# Patient Record
Sex: Male | Born: 1980 | Race: White | Hispanic: No | Marital: Single | Smoking: Never smoker
Health system: Western US, Community
[De-identification: ages and names within clinical notes are randomized; demographics above are authoritative.]

---

## 2016-08-06 ENCOUNTER — Inpatient Hospital Stay
Admit: 2016-08-06 | Discharge: 2016-08-07 | Disposition: A | Discharge status: Home / Self Care | Payer: PRIVATE HEALTH INSURANCE

## 2016-08-06 ENCOUNTER — Emergency Department: Admit: 2016-08-06 | Payer: PRIVATE HEALTH INSURANCE

## 2016-08-06 ENCOUNTER — Emergency Department: Payer: PRIVATE HEALTH INSURANCE

## 2016-08-06 DIAGNOSIS — N132 Hydronephrosis with renal and ureteral calculous obstruction: Secondary | ICD-10-CM

## 2016-08-06 LAB — CBC WITH AUTO DIFFERENTIAL
Basophils %: 1 % (ref 0–2)
Basophils, Absolute: 0.1 10*3/ÂµL (ref 0.0–0.2)
Eosinophils %: 0 % (ref 0–7)
Eosinophils, Absolute: 0 10*3/ÂµL (ref 0.0–0.7)
HCT: 45.8 % (ref 42.0–54.0)
Hemoglobin: 16 g/dL (ref 12.0–18.0)
Lymphocytes %: 18 % — ABNORMAL LOW (ref 25–45)
Lymphocytes, Absolute: 2 10*3/ÂµL (ref 1.1–4.3)
MCH: 32.2 pg (ref 27.0–34.0)
MCHC: 35.1 g/dL (ref 32.0–36.0)
MCV: 91.9 fL (ref 81.0–99.0)
MPV: 11.2 fL — ABNORMAL HIGH (ref 7.4–10.4)
Monocytes %: 7 % (ref 0–12)
Monocytes, Absolute: 0.7 10*3/ÂµL (ref 0.0–1.2)
Neutrophils %: 74 % — ABNORMAL HIGH (ref 35–70)
Neutrophils, Absolute: 8.1 10*3/ÂµL — ABNORMAL HIGH (ref 1.6–7.3)
Platelet Count: 193 10*3/ÂµL (ref 150–400)
RBC: 4.98 10*6/ÂµL (ref 4.70–6.10)
RDW: 12.8 % (ref 11.5–14.5)
WBC: 10.9 10*3/ÂµL — ABNORMAL HIGH (ref 4.8–10.8)

## 2016-08-06 LAB — COMPREHENSIVE METABOLIC PANEL
ALT - Alanine Aminotransferase: 44 IU/L (ref 7–52)
AST - Aspartate Aminotransferase: 21 IU/L (ref 10–50)
Albumin/Globulin Ratio: 2 (ref >0.9)
Albumin: 4.4 g/dL (ref 3.5–5.0)
Alkaline Phosphatase: 57 IU/L (ref 34–104)
Anion Gap: 9 mmol/L (ref 3.0–11.0)
BUN: 18 mg/dL (ref 6–23)
Bilirubin Total: 0.7 mg/dL (ref 0.3–1.2)
CO2 - Carbon Dioxide: 24 mmol/L (ref 21.0–31.0)
Calcium: 9.4 mg/dL (ref 8.6–10.3)
Chloride: 106 mmol/L (ref 98–111)
Creatinine: 1.19 mg/dL (ref 0.65–1.30)
GFR Estimate: >60 mL/min/{1.73_m2} (ref >=60)
Globulin: 2.2 g/dL (ref 2.2–3.7)
Glucose: 115 mg/dL — ABNORMAL HIGH (ref 80–99)
Potassium: 3.8 mmol/L (ref 3.5–5.1)
Protein Total: 6.6 g/dL (ref 6.0–8.0)
Sodium: 139 mmol/L (ref 135–143)

## 2016-08-06 MED ORDER — HYDROmorphone (DILAUDID) syringe 1 mg
1 | Freq: Once | INTRAMUSCULAR | Status: AC
Start: 2016-08-06 — End: 2016-08-06
  Administered 2016-08-06: 22:00:00 1 mg via INTRAVENOUS

## 2016-08-06 MED ORDER — ondansetron (ZOFRAN) injection 8 mg
4 | Freq: Once | INTRAMUSCULAR | Status: AC
Start: 2016-08-06 — End: 2016-08-06
  Administered 2016-08-06: 22:00:00 4 mg via INTRAVENOUS

## 2016-08-06 MED ORDER — sodium chloride 0.9 % (NS) syringe 10 mL
INTRAMUSCULAR | Status: DC | PRN
Start: 2016-08-06 — End: 2016-08-06

## 2016-08-06 MED FILL — HYDROMORPHONE (PF) 1 MG/ML INJECTION SYRINGE: 1 mg/mL | INTRAMUSCULAR | Qty: 1

## 2016-08-06 MED FILL — ONDANSETRON HCL (PF) 4 MG/2 ML INJECTION SOLUTION: 4 | INTRAMUSCULAR | Qty: 4

## 2016-08-06 NOTE — ED Triage Notes (Signed)
Sent over by urgent care for possible testicular tortion. Severe pain to left testicle and into his low back.

## 2016-08-06 NOTE — Discharge Instructions (Signed)
Kidney Stones °Kidney stones (urolithiasis) are deposits that form inside your kidneys. The intense pain is caused by the stone moving through the urinary tract. When the stone moves, the ureter goes into spasm around the stone. The stone is usually passed in the urine.  °CAUSES  °· A disorder that makes certain neck glands produce too much parathyroid hormone (primary hyperparathyroidism). °· A buildup of uric acid crystals, similar to gout in your joints. °· Narrowing (stricture) of the ureter. °· A kidney obstruction present at birth (congenital obstruction). °· Previous surgery on the kidney or ureters. °· Numerous kidney infections. °SYMPTOMS  °· Feeling sick to your stomach (nauseous). °· Throwing up (vomiting). °· Blood in the urine (hematuria). °· Pain that usually spreads (radiates) to the groin. °· Frequency or urgency of urination. °DIAGNOSIS  °· Taking a history and physical exam. °· Blood or urine tests. °· CT scan. °· Occasionally, an examination of the inside of the urinary bladder (cystoscopy) is performed. °TREATMENT  °· Observation. °· Increasing your fluid intake. °· Extracorporeal shock wave lithotripsy--This is a noninvasive procedure that uses shock waves to break up kidney stones. °· Surgery may be needed if you have severe pain or persistent obstruction. There are various surgical procedures. Most of the procedures are performed with the use of small instruments. Only small incisions are needed to accommodate these instruments, so recovery time is minimized. °The size, location, and chemical composition are all important variables that will determine the proper choice of action for you. Talk to your health care provider to better understand your situation so that you will minimize the risk of injury to yourself and your kidney.  °HOME CARE INSTRUCTIONS  °· Drink enough water and fluids to keep your urine clear or pale yellow. This will help you to pass the stone or stone fragments. °· Strain  all urine through the provided strainer. Keep all particulate matter and stones for your health care provider to see. The stone causing the pain may be as small as a grain of salt. It is very important to use the strainer each and every time you pass your urine. The collection of your stone will allow your health care provider to analyze it and verify that a stone has actually passed. The stone analysis will often identify what you can do to reduce the incidence of recurrences. °· Only take over-the-counter or prescription medicines for pain, discomfort, or fever as directed by your health care provider. °· Keep all follow-up visits as told by your health care provider. This is important. °· Get follow-up X-rays if required. The absence of pain does not always mean that the stone has passed. It may have only stopped moving. If the urine remains completely obstructed, it can cause loss of kidney function or even complete destruction of the kidney. It is your responsibility to make sure X-rays and follow-ups are completed. Ultrasounds of the kidney can show blockages and the status of the kidney. Ultrasounds are not associated with any radiation and can be performed easily in a matter of minutes. °· Make changes to your daily diet as told by your health care provider. You may be told to: °¨ Limit the amount of salt that you eat. °¨ Eat 5 or more servings of fruits and vegetables each day. °¨ Limit the amount of meat, poultry, fish, and eggs that you eat. °· Collect a 24-hour urine sample as told by your health care provider. You may need to collect another urine sample every 6-12   months. °SEEK MEDICAL CARE IF: °· You experience pain that is progressive and unresponsive to any pain medicine you have been prescribed. °SEEK IMMEDIATE MEDICAL CARE IF:  °· Pain cannot be controlled with the prescribed medicine. °· You have a fever or shaking chills. °· The severity or intensity of pain increases over 18 hours and is not  relieved by pain medicine. °· You develop a new onset of abdominal pain. °· You feel faint or pass out. °· You are unable to urinate. °  °This information is not intended to replace advice given to you by your health care provider. Make sure you discuss any questions you have with your health care provider. °  °Document Released: 12/15/2005 Document Revised: 09/05/2015 Document Reviewed: 05/18/2013 °Elsevier Interactive Patient Education ©2016 [employer] ° °

## 2016-08-06 NOTE — ED Provider Notes (Signed)
History   Manuel Perkins is a 35 y.o. year old male presenting with Groin Swelling  .    HPI patient is 35 years old. Noticed earlier today pain left testicle somewhat radiating up in the abdomen towards the left flank.  Pain was hurting intolerable for several hours but then about an hour prior to arrival acute onset severe pain is feeling at most it in the testicle. Seen in urgent care and sent over here for the possibility of testicular torsion. He's not noted any gross hematuria no fever chills or dysuria. No penile discharge.  No chest pain shortness of breath or any other complaints except for left lower quadrant radiating down into the testicle.  He thinks that the testicle may be swollen but really is not exam didn't significantly.    No past medical history on file.    No past surgical history on file.    No family history on file.    Social History   Substance Use Topics   . Smoking status: Not on file   . Smokeless tobacco: Not on file   . Alcohol use Not on file     No existing history information found.    Patient's Medications    No medications on file       Review of Systems review of systems is as in history of present illness otherwise reviewed without acute abnormalities    Physical Exam   BP 117/65  Pulse 86  Temp 36 ?C (96.8 ?F) (Temporal)   Resp 20  SpO2 95%    Physical Exam and oriented appears uncomfortable is pale not really writhing around alert oriented excellent historian does not appear tachypneic or short of breath  HEENT grossly negative  Lungs clear  Cardiovascular regular rate and rhythm  Abdomen soft there is some tenderness to left lower quadrant rebound or guarding normal active bowel sounds  Flank area mild tenderness to palpation of the left flank  Testicle appears normalNormal external genitalia with Normal vulva.  Normal vagina with no polyps or lesions and with physiologic discharge.  Normal cervix with normal mucosa and without CMT. i normal shaved there is no per to be  any enlargement the testicles and normal in size and shape no significant tenderness over the epididymis. Penis appears normal scrotum is completely normal  Lower extremities normal    ED Course     Results for orders placed or performed during the hospital encounter of 08/06/16 (from the past 24 hour(s))   CBC with Auto Differential -STAT    Collection Time: 08/06/16  3:45 PM   Result Value Ref Range    WBC 10.9 (H) 4.8 - 10.8 10*3/?L    RBC 4.98 4.70 - 6.10 10*6/?L    Hemoglobin 16.0 12.0 - 18.0 g/dL    HCT 74.2 59.5 - 63.8 %    MCV 91.9 81.0 - 99.0 fL    MCH 32.2 27.0 - 34.0 pg    MCHC 35.1 32.0 - 36.0 g/dL    RDW 75.6 43.3 - 29.5 %    Platelet Count 193 150 - 400 10*3/?L    MPV 11.2 (H) 7.4 - 10.4 fL    Neutrophils % 74 (H) 35 - 70 %    Lymphocytes % 18 (L) 25 - 45 %    Monocytes % 7 0 - 12 %    Eosinophils % 0 0 - 7 %    Basophils % 1 0 - 2 %    Neutrophils,  Absolute 8.1 (H) 1.6 - 7.3 10*3/?L    Lymphocytes, Absolute 2.0 1.1 - 4.3 10*3/?L    Monocytes, Absolute 0.7 0.0 - 1.2 10*3/?L    Eosinophils, Absolute 0.0 0.0 - 0.7 10*3/?L    Basophils, Absolute 0.1 0.0 - 0.2 10*3/?L    Differential Type Automated Differential    Comprehensive Metabolic Panel -STAT    Collection Time: 08/06/16  4:26 PM   Result Value Ref Range    Sodium 139 135 - 143 mmol/L    Potassium 3.8 3.5 - 5.1 mmol/L    Chloride 106 98 - 111 mmol/L    CO2 - Carbon Dioxide 24.0 21.0 - 31.0 mmol/L    Glucose 115 (H) 80 - 99 mg/dL    BUN 18 6 - 23 mg/dL    Creatinine 9.14 7.82 - 1.30 mg/dL    Calcium 9.4 8.6 - 95.6 mg/dL    AST - Aspartate Aminotransferase 21 10 - 50 IU/L    ALT - Alanine Amino transferase 44 7 - 52 IU/L    Alkaline Phosphatase 57 34 - 104 IU/L    Bilirubin Total 0.7 0.3 - 1.2 mg/dL    Protein Total 6.6 6.0 - 8.0 g/dL    Albumin 4.4 3.5 - 5.0 g/dL    Globulin 2.2 2.2 - 3.7 g/dL    Albumin/Globulin Ratio 2.0 >0.9    Anion Gap 9.0 3.0 - 11.0 mmol/L    GFR Estimate >60 >=60 mL/min/1.2m*2    GFR Additional Info         CT renal colic  without contrast   Final Result   IMPRESSION:  3 mm obstructing calculus LEFT proximal ureter at the level of the    L3 vertebral body, just beyond the LEFT ureteropelvic junction. Mild associated    LEFT hydronephrosis.               Ultrasound scrotum and testicles   Final Result   IMPRESSION:      Normal testicular ultrasound.                 Procedures    MDM ultrasound of his testicles were obtained to evaluate for the possibility of torsion and they are normal. Also looking up at the kidneys is no signs of obvious kidney stone on ultrasound but clinically he certainly appears to have a kidney stone.    Does not appear to have other reasons for abdominal pain  IV access obtained the patient is given pain medication and is significantly more comfortable.  CT renal colic is obtained and does show 3 mm kidney stone in the distal ureter.    Patient is now given Toradol and reports reasonable pain control and ultimately given one extra dose of Dilaudid.    At discharge she is pain-free.    I discussed with the patient that this stone should be expected to pass at 3 mm.    The certainly may be more painful and does have a bili that this will pass it is more painful as a spasming I am provided oxycodone.    ED Disposition: No Disposition on File    Diagnoses that have been ruled out:   None   Diagnoses that are still under consideration:   None   Final diagnoses:   None    3 mm kidney stone left ureter    DECISION TO ADMIT:          Kandice Robinsons, MD  08/06/16 2310

## 2016-08-07 ENCOUNTER — Emergency Department: Admit: 2016-08-07 | Payer: PRIVATE HEALTH INSURANCE

## 2016-08-07 ENCOUNTER — Inpatient Hospital Stay
Admit: 2016-08-07 | Discharge: 2016-08-07 | Disposition: A | Discharge status: Home / Self Care | Payer: PRIVATE HEALTH INSURANCE | Attending: Family

## 2016-08-07 DIAGNOSIS — N132 Hydronephrosis with renal and ureteral calculous obstruction: Secondary | ICD-10-CM

## 2016-08-07 LAB — BASIC METABOLIC PANEL
Anion Gap: 16 mmol/L — ABNORMAL HIGH (ref 3.0–11.0)
BUN: 19 mg/dL (ref 6–23)
CO2 - Carbon Dioxide: 21 mmol/L (ref 21.0–31.0)
Calcium: 9.9 mg/dL (ref 8.6–10.3)
Chloride: 102 mmol/L (ref 98–111)
Creatinine: 1.85 mg/dL — ABNORMAL HIGH (ref 0.65–1.30)
GFR Estimate: 42 mL/min/{1.73_m2} — ABNORMAL LOW (ref >=60)
Glucose: 116 mg/dL — ABNORMAL HIGH (ref 80–99)
Potassium: 3.7 mmol/L (ref 3.5–5.1)
Sodium: 139 mmol/L (ref 135–143)

## 2016-08-07 LAB — UA WITH REFLEX MICRO
Ascorbic Acid, Urine: NEGATIVE
Bacteria, Urine: NONE SEEN /HPF
Bilirubin, Urine: NEGATIVE
Glucose, Urine: NEGATIVE mg/dL
Ketones, Urine: NEGATIVE mg/dL
Leukocyte Esterase, Urine: NEGATIVE
Nitrite, Urine: NEGATIVE
Protein, Urine: NEGATIVE mg/dL
RBC, Urine: 15 /HPF — ABNORMAL HIGH (ref <=5)
Specific Gravity, Urine: 1.02 (ref 1.003–1.030)
Squamous Epithelial, Urine: 0 /HPF (ref <=5)
Urobilinogen, Urine: NORMAL mg/dL
White Blood Cell Microscopic Numeric: 1 /HPF (ref <=9)
pH, UA: 6 (ref 5.0–7.5)

## 2016-08-07 LAB — CREATININE ED
Creatinine: 1.65 mg/dL — ABNORMAL HIGH (ref 0.65–1.30)
GFR Estimate: 48 mL/min/{1.73_m2} — ABNORMAL LOW (ref >=60)

## 2016-08-07 LAB — CBC WITH AUTO DIFFERENTIAL
Basophils %: 1 % (ref 0–2)
Basophils, Absolute: 0.1 10*3/ÂµL (ref 0.0–0.2)
Eosinophils %: 0 % (ref 0–7)
Eosinophils, Absolute: 0 10*3/ÂµL (ref 0.0–0.7)
HCT: 45.3 % (ref 42.0–54.0)
Hemoglobin: 15.8 g/dL (ref 12.0–18.0)
Lymphocytes %: 12 % — ABNORMAL LOW (ref 25–45)
Lymphocytes, Absolute: 1.5 10*3/ÂµL (ref 1.1–4.3)
MCH: 32.1 pg (ref 27.0–34.0)
MCHC: 35 g/dL (ref 32.0–36.0)
MCV: 91.8 fL (ref 81.0–99.0)
MPV: 10.8 fL — ABNORMAL HIGH (ref 7.4–10.4)
Monocytes %: 7 % (ref 0–12)
Monocytes, Absolute: 0.9 10*3/ÂµL (ref 0.0–1.2)
Neutrophils %: 80 % — ABNORMAL HIGH (ref 35–70)
Neutrophils, Absolute: 10.3 10*3/??L — ABNORMAL HIGH (ref 1.6–7.3)
Platelet Count: 172 10*3/ÂµL (ref 150–400)
RBC: 4.93 10*6/ÂµL (ref 4.70–6.10)
RDW: 12.3 % (ref 11.5–14.5)
WBC: 12.9 10*3/ÂµL — ABNORMAL HIGH (ref 4.8–10.8)

## 2016-08-07 MED ORDER — naproxen (NAPROSYN) 500 MG tablet
500 | ORAL_TABLET | Freq: Two times a day (BID) | ORAL | 0 refills | 30.00000 days | Status: AC
Start: 2016-08-07 — End: 2016-08-17
  Filled 2016-08-07: qty 20, 10d supply, fill #0

## 2016-08-07 MED ORDER — ondansetron (ZOFRAN) injection 8 mg
4 | Freq: Once | INTRAMUSCULAR | Status: AC
Start: 2016-08-07 — End: 2016-08-07
  Administered 2016-08-07: 13:00:00 4 mg via INTRAVENOUS

## 2016-08-07 MED ORDER — sodium chloride (NS) 0.9% bolus 1,000 mL
Freq: Once | INTRAVENOUS | Status: AC
Start: 2016-08-07 — End: 2016-08-07
  Administered 2016-08-07: 14:00:00 via INTRAVENOUS

## 2016-08-07 MED ORDER — sodium chloride 0.9 % (NS) infusion
INTRAVENOUS | Status: AC
Start: 2016-08-07 — End: 2016-08-07
  Administered 2016-08-07: 13:00:00 via INTRAVENOUS

## 2016-08-07 MED ORDER — HYDROmorphone (DILAUDID) syringe 1 mg
1 | Freq: Once | INTRAMUSCULAR | Status: DC
Start: 2016-08-07 — End: 2016-08-06

## 2016-08-07 MED ORDER — ketorolac (TORADOL) injection 30 mg
30 | Freq: Once | INTRAMUSCULAR | Status: AC
Start: 2016-08-07 — End: 2016-08-07
  Administered 2016-08-07: 13:00:00 30 mg via INTRAVENOUS

## 2016-08-07 MED ORDER — tamsulosin (FLOMAX) 0.4 mg Cp24
0.4 | ORAL_CAPSULE | Freq: Every day | ORAL | 0 refills | 90.00000 days | Status: DC
Start: 2016-08-07 — End: 2018-02-05

## 2016-08-07 MED ORDER — HYDROmorphone (DILAUDID) 1 mg/mL syringe
1 | INTRAMUSCULAR | Status: AC
Start: 2016-08-07 — End: 2016-08-06
  Administered 2016-08-07: 01:00:00 1 mg/mL via INTRAVENOUS

## 2016-08-07 MED ORDER — HYDROmorphone (DILAUDID) syringe 1 mg
1 | Freq: Once | INTRAMUSCULAR | Status: AC
Start: 2016-08-07 — End: 2016-08-07
  Administered 2016-08-07: 13:00:00 1 mg via INTRAVENOUS

## 2016-08-07 MED ORDER — oxyCODONE (ROXICODONE) 5 MG immediate release tablet
5 | ORAL_TABLET | ORAL | 0 refills | 9.50000 days | Status: DC | PRN
Start: 2016-08-07 — End: 2016-08-10
  Filled 2016-08-07: qty 20, 4d supply, fill #0

## 2016-08-07 MED ORDER — promethazine (PHENERGAN) 25 MG tablet
25 | ORAL_TABLET | Freq: Four times a day (QID) | ORAL | 0 refills | Status: AC | PRN
Start: 2016-08-07 — End: 2016-08-13

## 2016-08-07 MED ORDER — oxyCODONE 10 mg Tab immediate release tablet
10 | ORAL_TABLET | ORAL | 0 refills | 9.50000 days | Status: AC
Start: 2016-08-07 — End: 2016-08-11

## 2016-08-07 MED FILL — ONDANSETRON HCL (PF) 4 MG/2 ML INJECTION SOLUTION: 4 | INTRAMUSCULAR | Qty: 4

## 2016-08-07 MED FILL — KETOROLAC 30 MG/ML (1 ML) INJECTION SOLUTION: 30 mg/mL | INTRAMUSCULAR | Qty: 1

## 2016-08-07 MED FILL — HYDROMORPHONE (PF) 1 MG/ML INJECTION SYRINGE: 1 mg/mL | INTRAMUSCULAR | Qty: 1

## 2016-08-07 MED FILL — SODIUM CHLORIDE 0.9 % INTRAVENOUS SOLUTION: 1000.0000 mL | INTRAVENOUS | Qty: 1000

## 2016-08-07 NOTE — ED Notes (Signed)
Pt ambulated to restroom without difficulty

## 2016-08-07 NOTE — ED Notes (Signed)
Pt resting quietly, lights out in room. [place] tech here to perform studt, NAD noted.

## 2016-08-07 NOTE — ED Triage Notes (Signed)
Pt c/o left flank pain, was seen here yesterday diagnosed with a kidney stone and sent home. Pt's pain was gone but returned worse this morning at 02:00. Has not urinated in 12 hours. Denies N/V/D.

## 2016-08-07 NOTE — ED Notes (Signed)
Pt's [place] study is complete, pt states pain starting to creep back up. 4-5/10. No nausea.

## 2016-08-07 NOTE — ED Provider Notes (Signed)
[facility] EMERGENCY DEPARTMENT ENCOUNTER    History and Physical     Name: Manuel Perkins  DOB: [birth year] 35 y.o.  MRN: [id number]  CSN: [id number]    HISTORY:     CHIEF COMPLAINT    Chief Complaint   Patient presents with   . Flank Pain       HPI    Argelio Granier is a 35 y.o. male who presents to the ER with continued pain from kidney stone. He had a 3 mm stone diagnosed via CT scan yesterday was the proximal ureter obstructing. Mild left hydronephrosis. He returns today with reports of increasing pain. He has states he has not put out urine in 12 hours. 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 He is taking oxycodone at home, as well as Flomax. He took 2 doses of oxycodone last night, states his symptoms are not improved. Bladder scan arrival to the room shows 91 ML's urine. He is afebrile.    PAST MEDICAL HISTORY    History reviewed. No pertinent past medical history.    SURGICAL HISTORY    History reviewed. No pertinent surgical history.    CURRENT MEDICATIONS    Prior to Admission medications    Medication Sig Start Date End Date Taking? Authorizing Provider   naproxen (NAPROSYN) 500 MG tablet Take 1 tablet by mouth 2 times daily with meals for 20 doses. 08/07/16 08/17/16  Youlanda Mighty, FNP   oxyCODONE (ROXICODONE) 5 MG immediate release tablet Take 1 tablet by mouth every 4 hours as needed for Pain for up to 10 days. 08/06/16 08/16/16  Kandice Robinsons, MD   oxyCODONE 10 mg Tab immediate release tablet Take 1 tablet by mouth every 4 hours for 20 doses. 08/07/16 08/11/16  Youlanda Mighty, FNP   promethazine (PHENERGAN) 25 MG tablet Take 1 tablet by mouth every 6 hours as needed for Nausea for up to 12 doses. 08/06/16 08/13/16  Kandice Robinsons, MD   tamsulosin (FLOMAX) 0.4 mg Cp24 Take 1 capsule by mouth daily. 08/06/16 09/05/16  Kandice Robinsons, MD       ALLERGIES    Review of patient's allergies indicates no known allergies.    FAMILY HISTORY    History reviewed. No pertinent  family history.    SOCIAL HISTORY    Social History   Substance Use Topics   . Smoking status: None   . Smokeless tobacco: None   . Alcohol use None     Other Social History Comments:       REVIEW OF SYSTEMS:   10 point review of systems negative except for history of present illness  PHYSICAL EXAM:   VITAL SIGNS:    Initial Vitals   BP 08/07/16 0538 203/89   Pulse 08/07/16 0538 94   Resp 08/07/16 0538 20   Temp 08/07/16 0538 36.7 ?C (98.1 ?F)   SpO2 08/07/16 0538 100 %     Constitutional:  Alert. Acutely distressed. Oriented. HENT:  Normocephalic, Atraumatic, Bilateral external ears normal, Oropharynx moist, No oral exudates, Nose normal. Neck- Normal range of motion, No tenderness, Supple, No stridor.   Eyes:  PERRL, EOMI, Conjunctiva normal, No discharge.   Respiratory:  Normal breath sounds, No respiratory distress, No wheezing, No chest tenderness.   Cardiovascular:  Normal heart rate, Normal rhythm, No murmurs, No rubs, No gallops.   GI:  No acute abdominal tenderness on exam. No focal findings. No peritoneal signs. GU:  External genitalia appear normal, No masses or lesions. No discharge. No CVA tenderness.   Musculoskeletal:  Intact distal pulses, No edema, No tenderness, No cyanosis, No clubbing. Good range of motion in all major joints. No tenderness to palpation or major deformities noted. Back- No tenderness.   Integument:  Pale in appearance.  Lymphatic:  No lymphadenopathy noted.   Neurologic:  Alert & oriented x 3, Normal motor function, Normal sensory function, No focal deficits noted.   Psychiatric: Anxious and in pain   DATA:       LABS    Results for orders placed or performed during the hospital encounter of 08/07/16 (from the past 24 hour(s))   Basic Metabolic Panel -STAT    Collection Time: 08/07/16  5:56 AM   Result Value Ref Range    Sodium 139 135 - 143 mmol/L    Potassium 3.7 3.5 - 5.1 mmol/L    Chloride 102 98 - 111 mmol/L    CO2 - Carbon Dioxide 21.0 21.0 - 31.0 mmol/L    Glucose 116 (H)  80 - 99 mg/dL    BUN 19 6 - 23 mg/dL    Creatinine 3.87 (H) 0.65 - 1.30 mg/dL    Calcium 9.9 8.6 - 56.4 mg/dL    Anion Gap 33.2 (H) 3.0 - 11.0 mmol/L    GFR Estimate 42 (L) >=60 mL/min/1.50m*2    GFR Additional Info     CBC with Auto Differential -STAT    Collection Time: 08/07/16  6:02 AM   Result Value Ref Range    WBC 12.9 (H) 4.8 - 10.8 10*3/?L    RBC 4.93 4.70 - 6.10 10*6/?L    Hemoglobin 15.8 12.0 - 18.0 g/dL    HCT 95.1 88.4 - 16.6 %    MCV 91.8 81.0 - 99.0 fL    MCH 32.1 27.0 - 34.0 pg    MCHC 35.0 32.0 - 36.0 g/dL    RDW 06.3 01.6 - 01.0 %    Platelet Count 172 150 - 400 10*3/?L    MPV 10.8 (H) 7.4 - 10.4 fL    Neutrophils % 80 (H) 35 - 70 %    Lymphocytes % 12 (L) 25 - 45 %    Monocytes % 7 0 - 12 %    Eosinophils % 0 0 - 7 %    Basophils % 1 0 - 2 %    Neutrophils, Absolute 10.3 (H) 1.6 - 7.3 10*3/?L    Lymphocytes, Absolute 1.5 1.1 - 4.3 10*3/?L    Monocytes, Absolute 0.9 0.0 - 1.2 10*3/?L    Eosinophils, Absolute 0.0 0.0 - 0.7 10*3/?L    Basophils, Absolute 0.1 0.0 - 0.2 10*3/?L    Differential Type Automated Differential    Urinalysis with Reflex Microscopic -Once    Collection Time: 08/07/16  6:38 AM   Result Value Ref Range    Color, Urine Yellow Yellow, Straw    Clarity, Urine Clear Clear    Glucose, Urine Negative Negative mg/dL    Bilirubin, Urine Negative Negative    Ketones, Urine Negative Negative mg/dL    Specific Gravity, Urine 1.020 1.003 - 1.030    Blood, Urine 2+ (A) Negative    pH, UA 6.0 5.0 - 7.5    Protein, Urine Negative Negative mg/dL    Urobilinogen, Urine Normal Normal mg/dL    Nitrite, Urine Negative Negative    Leukocyte Esterase, Urine Negative Negative    Ascorbic Acid, Urine Negative Negative    White Blood  Cell Microscopic Numeric 1 <=9 /HPF    RBC, Urine 15 (H) <=5 /HPF    Bacteria, Urine None Seen None Seen /HPF    Squamous Epithelial, Urine 0 <=5 /HPF   Creatinine ED -STAT    Collection Time: 08/07/16  9:59 AM   Result Value Ref Range    Creatinine 1.65 (H) 0.65 - 1.30  mg/dL    GFR Additional Info      GFR Estimate 48 (L) >=60 mL/min/1.88m*2       RADIOLOGY/PROCEDURES    Ultrasound Scrotum And Testicles    Result Date: 08/06/2016  US SCROTUM AND TESTICLES  08/06/2016 4:10 PM PROVIDED CLINICAL INDICATIONS:  Severe pain.    COMPARISON:  None. FINDINGS:  Right testis measures 4.0 x 2.4 x 3.8 cm, and the left testis measures 3.8 x 2.3 x 3.9 cm.  Both testes demonstrate normal homogeneous echogenicity without focal lesion. Normal color Doppler and arterial flow is seen in both testes. The epididymi are of normal size and echogenicity bilaterally.     Normal color doppler flow in the epididymi. No hydrocele. No varicocele. No scrotal wall thickening.     IMPRESSION: Normal testicular ultrasound.        Ct Renal Colic Without Contrast    Result Date: 08/06/2016  CT RENAL COLIC WO CONTRAST  08/06/2016 4:26 PM PROVIDED CLINICAL INDICATIONS:  Left flank pain    ADDITIONAL CLINICAL HISTORY:  None. COMPARISON:  None. TECHNIQUE: 5 mm thick contiguous axial imaging of the abdomen was performed with spiral technique without intravenous contrast using renal colic protocol.  Suboptimal evaluation of solid organs, viscera, and vascular structures due to lack of contrast. The images were reformatted in coronal and sagittal plane. Dose reduction techniques were used including but not limited to automated exposure control (AEC), iterative reconstruction technique, and/or mA and/or KV dose adjustments based on patient's size. FINDINGS:  Kidneys are symmetric in size. There is mild LEFT hydronephrosis. A 3 mm shortening calculus is demonstrated in the LEFT proximal ureter at the L3 vertebral body level, just beyond the ureteropelvic junction. No other nephrolithiasis is demonstrated. No right-sided hydronephrosis or exophytic renal masses bilaterally. Urinary bladder has a smooth contour. No bladder calculus. ABDOMEN: Liver: Visualized liver is normal in size and homogeneous in attenuation. Gallbladder: No  calcified gallstones. Pancreas: No peripancreatic fat stranding or fluid collections. Spleen: Visualized spleen has normal size and homogeneous attenuation. Adrenal glands: Normal. Aorta and IVC: Normal caliber. Stomach and bowel: Stomach is unremarkable. Visualized small and large bowel are nondilated. No pericolic fat stranding. Appendix is normal. PELVIS: Retroperitoneum and mesentery: No mass or adenopathy. Peritoneum: No free intraperitoneal fluid or pneumoperitoneum. No mass. Genitourinary: Prostate gland has a normal size. Abdominal wall: No hernia. Lower chest: Visualized lung bases are clear. Bones: No lytic or blastic osseous lesions.     IMPRESSION:  3 mm obstructing calculus LEFT proximal ureter at the level of the L3 vertebral body, just beyond the LEFT ureteropelvic junction. Mild associated LEFT hydronephrosis.     Ultrasound Kidney    Result Date: 08/07/2016  US KIDNEY  08/07/2016 9:14 AM PROVIDED CLINICAL INDICATIONS: evaluate for worsening left hydronephrosis    ADDITIONAL CLINICAL HISTORY: None. COMPARISON: Renal colic CT 08/06/2016. TECHNIQUE: Transabdominal real-time gray-scale and color-flow Doppler interrogation was performed. FINDINGS:  The kidneys are symmetric in size, with normal renal cortical thickness and echogenicity. Minimal left-sided hydronephrosis. No RIGHT hydronephrosis. No sonographic demonstration of nephrolithiasis. The right kidney measures 11.6 x 5.3 x 5.7 cm, with resistive  index of 0.65. The left kidney measures 12.2 x 6.0 x 5.4 cm, with resistive index of 0.73. No cysts or solid renal masses. The bladder is smooth walled without filling defect. Mild postvoid residual of 22 mL.  RIGHT ureteral jet is demonstrated. LEFT ureteral jet is not visualized. Incidental demonstration of mild hepatic steatosis with increased echogenicity of the liver parenchyma.     IMPRESSION:  Minimal left-sided hydronephrosis, not progressed from prior exam. LEFT ureteral jet is also not visualized  at the urinary bladder. No right-sided hydronephrosis. Incidental demonstration of hepatic steatosis.      PLAN:     ED COURSE & MEDICAL DECISION MAKING    35 year old male presents with continuing pain secondary to 3 mm kidney stone in the proximal ureter on the left. He is taking oxycodone at home, no other anti-inflammatories. Pain is quite severe arrival. The patient is concerned of the possibility of kidney failure.    Saline lock placed. Given 1 L normal saline. Small amount of urine in his bladder on arrival    Dilaudid 1 mg IV, 30 mg Toradol IV, 8 mg Zofran IV    Patient gets good relief of his discomfort with treatments, he does have some hypoxia after administration of IV Dilaudid. I believe this is combining with oral narcotic she took prior to his arrival precipitating this, but is easily arousable, no airway protective issues.    BMP demonstrates a bump in his creatinine to 1.85 today from 1.2 yesterday.    Ultrasound is repeated which demonstrates no significant hydro or increase in hydro, though jets or unable to be seen. A consult radiology on the lack of chest given this 3 mm stone, states this is often a normal variant and medically significant for obstruction, but not as concerning without severe hydronephrosis.    CBC demonstrates white blood cell count 12.9, no bandemia  Urinalysis negative for infection    Patient was given 1 L normal saline, and he does consume 2 L oral intake. No vomiting. Pain is persistently well-controlled. I did recheck his creatinine the ER, sound 1.65 after fluids.    The pain is well controlled and oblique. Requires hospital admission. I did give him a prescription for 10 mg oxycodone to take instead of the 5 mg tablets, in hopes that this will control his pain. Added naproxen to his current pain regimen. He will continue Flomax 0.4 mg once a day.    FINAL IMPRESSION    1. 3 mm obstructing stone left proximal ureter   2. Dehydration  3. Elevated creatinine    This  document was created with the assistance of voice-to-text technology. Effort has been made to minimize transcription errors. Please allow for homonyms and other similar transcription errors.       Youlanda Mighty, FNP  08/07/16 1534

## 2016-08-07 NOTE — ED Notes (Signed)
Pt sleeping, NAD noted.

## 2016-08-07 NOTE — Discharge Instructions (Signed)
I have given you a prescription for oxycodone twice the strength of what you are taking. Please take this over the next several days. Add naproxen 500 mg  If you develop severe pain increased, or inability to urinate please return.  You were mildly dehydrated, but sure kidney function is improving during her ER stay and after fluid  Please call Dr. Stacie Acres for follow-up  Dehydration, Adult  Dehydration means your body does not have as much fluid or water as it needs. It happens when you take in less fluid than you lose. Your kidneys, brain, and heart will not work properly without the right amount of fluids.   Dehydration can range from mild to severe. It should be treated right away to help prevent it from becoming severe.  HOME CARE  ? Drink enough fluid to keep your pee (urine) clear or pale yellow.  ? Drink water or fluid slowly by taking small sips. You can also try sucking on ice cubes.  ? Have food or drinks that contain electrolytes. Examples include bananas and sports drinks.  ? Take over-the-counter and prescription medicines only as told by your doctor.  ? Prepare oral rehydration solution (ORS) according to the instructions that came with it. Take sips of ORS every 5 minutes until your pee returns to normal.  ? If you are throwing up (vomiting) or have watery poop (diarrhea), keep trying to drink water, ORS, or both.  ? If you have watery poop, avoid:  ? Drinks with caffeine.  ? Fruit juice.  ? Milk.  ? Carbonated soft drinks.  ? Do not take salt tablets. This can lead to having too much sodium in your body (hypernatremia).  GET HELP IF:  ? You cannot eat or drink without throwing up.  ? You have had mild watery poop for longer than 24 hours.  ? You have a fever.  GET HELP RIGHT AWAY IF:   ? You have very strong thirst.  ? You have very bad watery poop.  ? You have not peed in 6-8 hours, or you have peed only a small amount of very dark pee.  ? You have shriveled skin.  ? You are dizzy, confused, or  both.     This information is not intended to replace advice given to you by your health care provider. Make sure you discuss any questions you have with your health care provider.     Document Released: 10/11/2009 Document Revised: 09/05/2015 Document Reviewed: 05/02/2015  Elsevier Interactive Patient Education ?2016 Else[employer]      Kidney Stones  Kidney stones (urolithiasis) are solid masses that form inside your kidneys. The intense pain is caused by the stone moving through the kidney, ureter, bladder, and urethra (urinary tract). When the stone moves, the ureter starts to spasm around the stone. The stone is usually passed in your pee (urine).   HOME CARE  ? Drink enough fluids to keep your pee clear or pale yellow. This helps to get the stone out.  ? Take a 24-hour pee (urine) sample as told by your doctor. You may need to take another sample every 6-12 months.  ? Strain all pee through the provided strainer. Do not pee without peeing through the strainer, not even once. If you pee the stone out, catch it in the strainer. The stone may be as small as a grain of salt. Take this to your doctor. This will help your doctor figure out what you can do to try  to prevent more kidney stones.  ? Only take medicine as told by your doctor.  ? Make changes to your daily diet as told by your doctor. You may be told to:    Limit how much salt you eat.    Eat 5 or more servings of fruits and vegetables each day.    Limit how much meat, poultry, fish, and eggs you eat.  ? Keep all follow-up visits as told by your doctor. This is important.  ? Get follow-up X-rays as told by your doctor.  GET HELP IF:  You have pain that gets worse even if you have been taking pain medicine.  GET HELP RIGHT AWAY IF:   ? Your pain does not get better with medicine.  ? You have a fever or shaking chills.  ? Your pain increases and gets worse over 18 hours.  ? You have new belly (abdominal) pain.  ? You feel faint or pass out.  ? You are unable  to pee.     This information is not intended to replace advice given to you by your health care provider. Make sure you discuss any questions you have with your health care provider.     Document Released: 06/02/2008 Document Revised: 09/05/2015 Document Reviewed: 05/18/2013  Elsevier Interactive Patient Education ?2016 Elsevier Inc.

## 2016-08-08 ENCOUNTER — Emergency Department: Admit: 2016-08-08 | Payer: PRIVATE HEALTH INSURANCE

## 2016-08-08 ENCOUNTER — Inpatient Hospital Stay
Admission: EM | Admit: 2016-08-08 | Discharge: 2016-08-10 | Disposition: A | Discharge status: Home / Self Care | Payer: PRIVATE HEALTH INSURANCE | Admitting: DO

## 2016-08-08 DIAGNOSIS — K567 Ileus, unspecified: Secondary | ICD-10-CM

## 2016-08-08 LAB — BASIC METABOLIC PANEL
Anion Gap: 10 mmol/L (ref 3.0–11.0)
BUN: 13 mg/dL (ref 6–23)
CO2 - Carbon Dioxide: 27 mmol/L (ref 21.0–31.0)
Calcium: 9.2 mg/dL (ref 8.6–10.3)
Chloride: 94 mmol/L — ABNORMAL LOW (ref 98–111)
Creatinine: 1.7 mg/dL — ABNORMAL HIGH (ref 0.65–1.30)
GFR Estimate: 46 mL/min/{1.73_m2} — ABNORMAL LOW (ref >=60)
Glucose: 137 mg/dL — ABNORMAL HIGH (ref 80–99)
Potassium: 4 mmol/L (ref 3.5–5.1)
Sodium: 131 mmol/L — ABNORMAL LOW (ref 135–143)

## 2016-08-08 LAB — PT & PTT
APTT: 25 Seconds (ref 23–36)
INR: 1 (ref 0.9–1.2)
Protime: 11.5 Seconds (ref 9.9–13.4)

## 2016-08-08 LAB — UA WITH AUTO MICRO
Ascorbic Acid, Urine: NEGATIVE
Bacteria, Urine: NONE SEEN /HPF
Bilirubin, Urine: NEGATIVE
Blood, Urine: NEGATIVE
Glucose, Urine: NEGATIVE mg/dL
Ketones, Urine: NEGATIVE mg/dL
Leukocyte Esterase, Urine: NEGATIVE
Nitrite, Urine: NEGATIVE
Protein, Urine: NEGATIVE mg/dL
RBC, Urine: 0 /HPF (ref <=5)
Specific Gravity, Urine: 1.023 (ref 1.003–1.030)
Squamous Epithelial, Urine: 0 /HPF (ref <=5)
Urobilinogen, Urine: NORMAL mg/dL
White Blood Cell Microscopic Numeric: 3 /HPF (ref <=9)
pH, UA: 5 (ref 5.0–7.5)

## 2016-08-08 LAB — CBC WITH AUTO DIFFERENTIAL
Basophils %: 0 % (ref 0–2)
Basophils, Absolute: 0.1 10*3/ÂµL (ref 0.0–0.2)
Eosinophils %: 0 % (ref 0–7)
Eosinophils, Absolute: 0 10*3/ÂµL (ref 0.0–0.7)
HCT: 44 % (ref 42.0–54.0)
Hemoglobin: 15.2 g/dL (ref 12.0–18.0)
Lymphocytes %: 5 % — ABNORMAL LOW (ref 25–45)
Lymphocytes, Absolute: 0.7 10*3/ÂµL — ABNORMAL LOW (ref 1.1–4.3)
MCH: 31.7 pg (ref 27.0–34.0)
MCHC: 34.6 g/dL (ref 32.0–36.0)
MCV: 91.7 fL (ref 81.0–99.0)
MPV: 10.7 fL — ABNORMAL HIGH (ref 7.4–10.4)
Monocytes %: 9 % (ref 0–12)
Monocytes, Absolute: 1.2 10*3/??L (ref 0.0–1.2)
Neutrophils %: 86 % — ABNORMAL HIGH (ref 35–70)
Neutrophils, Absolute: 12.3 10*3/??L — ABNORMAL HIGH (ref 1.6–7.3)
Platelet Count: 155 10*3/ÂµL (ref 150–400)
RBC: 4.8 10*6/ÂµL (ref 4.70–6.10)
RDW: 12.4 % (ref 11.5–14.5)
WBC: 14.2 10*3/ÂµL — ABNORMAL HIGH (ref 4.8–10.8)

## 2016-08-08 LAB — LIPASE: Lipase: 12 IU/L (ref 10–60)

## 2016-08-08 LAB — BLOOD CULTURE
Blood Culture Result: NO GROWTH
Blood Culture Result: NO GROWTH

## 2016-08-08 LAB — URINE CULTURE

## 2016-08-08 MED ORDER — naloxone (NARCAN) injection 0.08 mg
0.4 | INTRAMUSCULAR | Status: DC | PRN
Start: 2016-08-08 — End: 2016-08-10

## 2016-08-08 MED ORDER — ketorolac (TORADOL) injection 15 mg
15 | Freq: Once | INTRAMUSCULAR | Status: AC
Start: 2016-08-08 — End: 2016-08-08
  Administered 2016-08-08: 13:00:00 15 mg via INTRAVENOUS

## 2016-08-08 MED ORDER — methylnaltrexone (RELISTOR) injection 8 mg
12 | Freq: Once | SUBCUTANEOUS | Status: AC
Start: 2016-08-08 — End: 2016-08-08
  Administered 2016-08-08: 13:00:00 12 mg via SUBCUTANEOUS

## 2016-08-08 MED ORDER — Normal saline infusion for medication/blood product administration for nursing
0.9 | INTRAVENOUS | Status: DC | PRN
Start: 2016-08-08 — End: 2016-08-10

## 2016-08-08 MED ORDER — sodium chloride 0.9 % (NS) syringe 10 mL
INTRAMUSCULAR | Status: DC | PRN
Start: 2016-08-08 — End: 2016-08-10

## 2016-08-08 MED ORDER — ondansetron (ZOFRAN) injection 4 mg
4 | Freq: Four times a day (QID) | INTRAMUSCULAR | Status: DC | PRN
Start: 2016-08-08 — End: 2016-08-10

## 2016-08-08 MED ORDER — famotidine (PF) (PEPCID) 20 mg in 0.9% NaCl IVPB Premix
20 | Freq: Two times a day (BID) | INTRAVENOUS | Status: DC
Start: 2016-08-08 — End: 2016-08-10
  Administered 2016-08-09 (×2): 2050 mg via INTRAVENOUS
  Administered 2016-08-09 (×2): 20 mg via INTRAVENOUS
  Administered 2016-08-10: 04:00:00 2050 mg via INTRAVENOUS
  Administered 2016-08-10 (×2): 20 mg via INTRAVENOUS
  Administered 2016-08-10: 16:00:00 2050 mg via INTRAVENOUS

## 2016-08-08 MED ORDER — sodium chloride 0.9 %
INTRAVENOUS | Status: DC
Start: 2016-08-08 — End: 2016-08-10
  Administered 2016-08-08 – 2016-08-10 (×6): via INTRAVENOUS

## 2016-08-08 MED ORDER — diatrizoate meglumine-sodium (GASTROGRAFIN) 66-10 % oral solution 20 mL
66-10 | Freq: Once | ORAL | Status: AC
Start: 2016-08-08 — End: 2016-08-08
  Administered 2016-08-08: 14:00:00 66-10 mL via ORAL

## 2016-08-08 MED ORDER — tamsulosin (FLOMAX) 24 hr capsule 0.4 mg
0.4 | Freq: Every day | ORAL | Status: DC
Start: 2016-08-08 — End: 2016-08-10
  Administered 2016-08-08 – 2016-08-10 (×3): 0.4 mg via ORAL

## 2016-08-08 MED ORDER — sodium chloride (NS) 0.9% bolus 2,000 mL
Freq: Once | INTRAVENOUS | Status: AC
Start: 2016-08-08 — End: 2016-08-08
  Administered 2016-08-08 (×2): via INTRAVENOUS

## 2016-08-08 MED ORDER — famotidine (PF) (PEPCID) 20 mg in 0.9% NaCl IVPB Premix
20 | Freq: Once | INTRAVENOUS | Status: AC
Start: 2016-08-08 — End: 2016-08-08
  Administered 2016-08-08: 13:00:00 20 mg via INTRAVENOUS
  Administered 2016-08-08: 14:00:00 2050 mg via INTRAVENOUS

## 2016-08-08 MED ORDER — methylnaltrexone (RELISTOR) injection 8 mg
12 | SUBCUTANEOUS | Status: DC
Start: 2016-08-08 — End: 2016-08-08

## 2016-08-08 MED ORDER — ketorolac (TORADOL) injection 30 mg
15 | Freq: Four times a day (QID) | INTRAMUSCULAR | Status: DC | PRN
Start: 2016-08-08 — End: 2016-08-10
  Administered 2016-08-08 – 2016-08-09 (×3): 15 mg via INTRAVENOUS

## 2016-08-08 MED ORDER — GI cocktail
200-200-20 | Freq: Once | ORAL | Status: AC
Start: 2016-08-08 — End: 2016-08-08
  Administered 2016-08-08: 13:00:00 via ORAL

## 2016-08-08 MED ORDER — ondansetron (ZOFRAN) injection 4 mg
4 | Freq: Once | INTRAMUSCULAR | Status: DC | PRN
Start: 2016-08-08 — End: 2016-08-08

## 2016-08-08 MED ORDER — sodium chloride 0.9 % (NS) syringe 10 mL
Freq: Three times a day (TID) | INTRAMUSCULAR | Status: DC
Start: 2016-08-08 — End: 2016-08-10
  Administered 2016-08-08 – 2016-08-10 (×2): via INTRAVENOUS

## 2016-08-08 MED ORDER — morphine injection 2 mg
2 | INTRAVENOUS | Status: DC | PRN
Start: 2016-08-08 — End: 2016-08-08
  Administered 2016-08-08: 20:00:00 2 mg via INTRAVENOUS

## 2016-08-08 MED ORDER — morphine injection 4 mg
4 | INTRAVENOUS | Status: DC | PRN
Start: 2016-08-08 — End: 2016-08-08

## 2016-08-08 MED FILL — SODIUM CHLORIDE 0.9 % INTRAVENOUS SOLUTION: 2000.0000 mL | INTRAVENOUS | Qty: 2000

## 2016-08-08 MED FILL — KETOROLAC 15 MG/ML INJECTION SOLUTION: 15 mg/mL | INTRAMUSCULAR | Qty: 2

## 2016-08-08 MED FILL — MAG-AL PLUS 200 MG-200 MG-20 MG/5 ML ORAL SUSPENSION: 200-200-20 | ORAL | Qty: 30

## 2016-08-08 MED FILL — MORPHINE 2 MG/ML INTRAVENOUS CARTRIDGE: 2 mg/mL | INTRAVENOUS | Qty: 1

## 2016-08-08 MED FILL — RELISTOR 12 MG/0.6 ML SUBCUTANEOUS SOLUTION: 12 mg/0.6 mL | SUBCUTANEOUS | Qty: 0.6

## 2016-08-08 MED FILL — LIDOCAINE VISCOUS 2 % MUCOSAL SOLUTION: 2 % | Qty: 15

## 2016-08-08 MED FILL — KETOROLAC 15 MG/ML INJECTION SOLUTION: 15 mg/mL | INTRAMUSCULAR | Qty: 1

## 2016-08-08 MED FILL — TAMSULOSIN 0.4 MG CAPSULE: 0.4 mg | ORAL | Qty: 1

## 2016-08-08 MED FILL — FAMOTIDINE (PF) IN 0.9% NACL 20 MG/50 ML IVPB PREMIX: 20 mg/50 mL | INTRAVENOUS | Qty: 50

## 2016-08-08 MED FILL — SODIUM CHLORIDE 0.9 % INTRAVENOUS SOLUTION: INTRAVENOUS | Qty: 1000

## 2016-08-08 NOTE — ED Notes (Signed)
Pt given ice chips, ok per MD. Pt c/o HA, MD aware. Pt laying on gurney in NAD, talking on cell phone, lights dimmed for comfort.

## 2016-08-08 NOTE — ED Notes (Signed)
MD at bedside to discuss POC.

## 2016-08-08 NOTE — H&P (Signed)
History and Physical      Name: Manuel Perkins  DOB: 11/04/1981 35 y.o.  MRN: [id number]  CSN: [id number]    History:     Chief Complaint:  Abdominal Pain    History of Present Illness:  Manuel Perkins is a 35 y.o. male who presents for evaluation of abd pain, nausea and vomiting. Symptoms have been severe. The symptoms have occurred over the last 3 days, and occur all day. The patient states that he has been seen in the ER the past couple of day for a kidney stone, he has been taking narcotics because of the pain he has been in. His last BM was Wednesday. Today, he woke up with more pain, nausea and vomiting. The patient presented to the ER because he could not keep any food/liquid down. The patient states he feels bloated, and also cannot pass any flatus.     Past Medical History:  History reviewed. No pertinent past medical history.  Past Surgical History:  History reviewed. No pertinent surgical history.  Current Medications:  Prior to Admission Medications    Medication Dose & Frequency   naproxen (NAPROSYN) 500 MG tablet Take 1 tablet by mouth 2 times daily with meals for 20 doses.   oxyCODONE (ROXICODONE) 5 MG immediate release tablet Take 1 tablet by mouth every 4 hours as needed for Pain for up to 10 days.   oxyCODONE 10 mg Tab immediate release tablet Take 1 tablet by mouth every 4 hours for 20 doses.   promethazine (PHENERGAN) 25 MG tablet Take 1 tablet by mouth every 6 hours as needed for Nausea for up to 12 doses.   tamsulosin (FLOMAX) 0.4 mg Cp24 Take 1 capsule by mouth daily.        Allergies:  Review of patient's allergies indicates no known allergies.    Family History:  History reviewed. No pertinent family history.  Social History:  Social History   Substance Use Topics   . Smoking status: Never Smoker   . Smokeless tobacco: Former Neurosurgeon   . Alcohol use Yes      Comment: 1-3 wk     Immunization:    There is no immunization history on file for this patient.  Review of Systems:  Review of Systems      Constitutional: Negative.    HENT: Negative.    Eyes: Negative.    Respiratory: Negative.    Cardiovascular: Negative.    Gastrointestinal: Positive for abdominal pain, constipation, nausea and vomiting. Negative for diarrhea.   Genitourinary: Positive for flank pain and hematuria.   Skin: Negative.    Neurological: Negative.    Endo/Heme/Allergies: Negative.    Psychiatric/Behavioral: Negative.        Physical Exam:     Vital Signs:  Initial Vitals   BP 08/08/16 0545 136/75   Pulse 08/08/16 0545 114   Resp 08/08/16 0545 16   Temp 08/08/16 0545 37.2 ?C (98.9 ?F)   SpO2 08/08/16 0545 97 %     Exam:  Physical Exam   Constitutional: He is oriented to person, place, and time. He appears well-developed and well-nourished.   HENT:   Head: Normocephalic.   Eyes: Pupils are equal, round, and reactive to light.   Neck: Normal range of motion. Neck supple.   Cardiovascular: Normal rate, regular rhythm and normal heart sounds.    Pulmonary/Chest: Effort normal and breath sounds normal.   Abdominal: He exhibits distension. There is tenderness.   Musculoskeletal: Normal range of motion.  Neurological: He is alert and oriented to person, place, and time.   Skin: Skin is warm and dry.   Psychiatric: He has a normal mood and affect. His behavior is normal.       Data:     Labs:  Results for orders placed or performed during the hospital encounter of 08/08/16 (from the past 24 hour(s))   Urinalysis with Microscopic -Once    Collection Time: 08/08/16  6:04 AM   Result Value Ref Range    Color, Urine Yellow Yellow, Straw    Clarity, Urine Clear Clear    Glucose, Urine Negative Negative mg/dL    Bilirubin, Urine Negative Negative    Ketones, Urine Negative Negative mg/dL    Specific Gravity, Urine 1.023 1.003 - 1.030    Blood, Urine Negative Negative    pH, UA 5.0 5.0 - 7.5    Protein, Urine Negative Negative mg/dL    Urobilinogen, Urine Normal Normal mg/dL    Nitrite, Urine Negative Negative    Leukocyte Esterase, Urine Negative  Negative    Ascorbic Acid, Urine Negative Negative    White Blood Cell Microscopic Numeric 3 <=9 /HPF    RBC, Urine 0 <=5 /HPF    Bacteria, Urine None Seen None Seen /HPF    Squamous Epithelial, Urine 0 <=5 /HPF    Mucous, Urine 1+ 0 /LPF   Basic Metabolic Panel -STAT    Collection Time: 08/08/16  6:07 AM   Result Value Ref Range    Sodium 131 (L) 135 - 143 mmol/L    Potassium 4.0 3.5 - 5.1 mmol/L    Chloride 94 (L) 98 - 111 mmol/L    CO2 - Carbon Dioxide 27.0 21.0 - 31.0 mmol/L    Glucose 137 (H) 80 - 99 mg/dL    BUN 13 6 - 23 mg/dL    Creatinine 1.61 (H) 0.65 - 1.30 mg/dL    Calcium 9.2 8.6 - 09.6 mg/dL    Anion Gap 04.5 3.0 - 11.0 mmol/L    GFR Estimate 46 (L) >=60 mL/min/1.5m*2    GFR Additional Info     Lipase -STAT    Collection Time: 08/08/16  6:07 AM   Result Value Ref Range    Lipase 12 10 - 60 IU/L   CBC with Auto Differential -STAT    Collection Time: 08/08/16  6:11 AM   Result Value Ref Range    WBC 14.2 (H) 4.8 - 10.8 10*3/?L    RBC 4.80 4.70 - 6.10 10*6/?L    Hemoglobin 15.2 12.0 - 18.0 g/dL    HCT 40.9 81.1 - 91.4 %    MCV 91.7 81.0 - 99.0 fL    MCH 31.7 27.0 - 34.0 pg    MCHC 34.6 32.0 - 36.0 g/dL    RDW 78.2 95.6 - 21.3 %    Platelet Count 155 150 - 400 10*3/?L    MPV 10.7 (H) 7.4 - 10.4 fL    Neutrophils % 86 (H) 35 - 70 %    Lymphocytes % 5 (L) 25 - 45 %    Monocytes % 9 0 - 12 %    Eosinophils % 0 0 - 7 %    Basophils % 0 0 - 2 %    Neutrophils, Absolute 12.3 (H) 1.6 - 7.3 10*3/?L    Lymphocytes, Absolute 0.7 (L) 1.1 - 4.3 10*3/?L    Monocytes, Absolute 1.2 0.0 - 1.2 10*3/?L    Eosinophils, Absolute 0.0 0.0 - 0.7 10*3/?L  Basophils, Absolute 0.1 0.0 - 0.2 10*3/?L   PT & PTT -STAT    Collection Time: 08/08/16  6:11 AM   Result Value Ref Range    Protime 11.5 9.9 - 13.4 Seconds    APTT 25 23 - 36 Seconds    INR 1.0 0.9 - 1.2          Imaging for last 24 hours:  Ct Abdomen Pelvis Without Iv Contrast    Addendum Date: 08/08/2016    ADDENDUM #1 There is evidence of an ileus pattern of air-fluid  levels in the colon, particularly the transverse colon.    Result Date: 08/08/2016  IMPRESSION:  1. Progression of obstructive uropathy of the LEFT kidney with high-grade obstructive changes, hydronephrosis and passage of the stone previously identified into the LEFT ureterovesical junction.     X-ray Abdomen 2 View    Result Date: 08/08/2016  IMPRESSION:  Mildly dilated large bowel with air-fluid levels most consistent with mild ileus or diarrheal illness.       Assessment:     Diagnoses:  Active Hospital Problems    Diagnosis SNOMED CT(R) Date Noted   . 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 Left nephrolithiasis KIDNEY STONE 08/08/2016   . Hydronephrosis with urinary obstruction due to ureteral calculus HYDRONEPHROSIS WITH RENAL AND URETERAL CALCULOUS OBSTRUCTION 08/08/2016   . Non-intractable vomiting with nausea, unspecified vomiting type NAUSEA AND VOMITING 08/08/2016   . AKI (acute kidney injury) (CMS/HCC) INJURY OF KIDNEY 08/08/2016       Plan:   1. Ileus  - Likely secondary to Opioid use  - Tramadol prn pain  - Bowel rest, NPO, IVF  - Discussed with the patient the possibility of NG tube placement if conservative measures do not resolve the ileus  - Continue supportive care for now    2. 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  - ER contacted urology in [place], Dr. Shepard General, on-call urologist in [place] at [facility], who felt no emergency surgery was indicated and that his elevated creatinine was not too concerning given the short duration. Without infection on UA he felt stenting or nephrostomy tube could be performed as an outpatient  - UA negative  - Continue IVF, Flomax    3. AKI  - Secondary to #2.   - Continue to monitor for passage of stone  - Repeat BMP in am  - If renal function  worsens will contact urology again        Michele Mcalpine  08/08/2016  3:43 PM

## 2016-08-08 NOTE — ED Provider Notes (Signed)
History   Manuel Perkins is a 35 y.o. year old male presenting with Abdominal Pain  .    HPI  35 year old man returns to the ER for reevaluation of persistent pain, known left 3 mm obstructing calculus in the proximal ureter initially diagnosed 8/9 on an ER visit here and returned on 8/10 for persistent pain though evaluation without concerning findings except for mild elevation of creatinine which improved after IV fluid. He returns today with a change in his pain pattern, he reports more anterior global abdominal pain and pressure, has not had a bowel movement in 2 days. He has been taking narcotics for his renal colic. He has had vomiting stating last vomitus was 4 PM yesterday was the water that he drank throughout the day. He's had some mild nausea since then the currently not nauseous. He is urinating without pain or blood but does report decreased urination and unsatisfying small amounts. No fever or chills.    No past medical history on file.    No past surgical history on file.    No family history on file.    Social History   Substance Use Topics   . Smoking status: Not on file   . Smokeless tobacco: Not on file   . Alcohol use Not on file     Other Social History Comments:       Patient's Medications   New Prescriptions    No medications on file   Home Medications Prior to ED Visit    NAPROXEN (NAPROSYN) 500 MG TABLET    Take 1 tablet by mouth 2 times daily with meals for 20 doses.    OXYCODONE (ROXICODONE) 5 MG IMMEDIATE RELEASE TABLET    Take 1 tablet by mouth every 4 hours as needed for Pain for up to 10 days.    OXYCODONE 10 MG TAB IMMEDIATE RELEASE TABLET    Take 1 tablet by mouth every 4 hours for 20 doses.    PROMETHAZINE (PHENERGAN) 25 MG TABLET    Take 1 tablet by mouth every 6 hours as needed for Nausea for up to 12 doses.    TAMSULOSIN (FLOMAX) 0.4 MG CP24    Take 1 capsule by mouth daily.   Modified Medications    No medications on file   Discontinued Medications    No medications on file          Review of Systems  See history of present illness. All other systems reviewed and negative.  Physical Exam   BP 131/81  Pulse 90  Temp 37.2 ?C (98.9 ?F) (Temporal)   Resp 16  Wt 81.6 kg (180 lb)  SpO2 100%    Physical Exam   Constitutional: He appears well-developed and well-nourished. No distress.   HENT:   Head: Normocephalic.   Eyes: Pupils are equal, round, and reactive to light.   Neck: Normal range of motion.   Cardiovascular: Regular rhythm, normal heart sounds and intact distal pulses.  Tachycardia present.  Exam reveals no gallop and no friction rub.    No murmur heard.  Pulmonary/Chest: Effort normal and breath sounds normal. No respiratory distress. He has no wheezes. He has no rales. He exhibits no tenderness.   Abdominal: Soft. He exhibits no distension and no mass. Bowel sounds are decreased. There is tenderness (mild diffuse). There is no rebound and no guarding.   Musculoskeletal: Normal range of motion.   Neurological: He is alert. No cranial nerve deficit. He exhibits normal muscle tone. Coordination  normal.   Skin: Skin is warm and dry.   Psychiatric: He has a normal mood and affect.   Nursing note and vitals reviewed.      ED Course     Results for orders placed or performed during the hospital encounter of 08/08/16 (from the past 24 hour(s))   Urinalysis with Microscopic -Once    Collection Time: 08/08/16  6:04 AM   Result Value Ref Range    Color, Urine Yellow Yellow, Straw    Clarity, Urine Clear Clear    Glucose, Urine Negative Negative mg/dL    Bilirubin, Urine Negative Negative    Ketones, Urine Negative Negative mg/dL    Specific Gravity, Urine 1.023 1.003 - 1.030    Blood, Urine Negative Negative    pH, UA 5.0 5.0 - 7.5    Protein, Urine Negative Negative mg/dL    Urobilinogen, Urine Normal Normal mg/dL    Nitrite, Urine Negative Negative    Leukocyte Esterase, Urine Negative Negative    Ascorbic Acid, Urine Negative Negative    White Blood Cell Microscopic Numeric 3 <=9 /HPF     RBC, Urine 0 <=5 /HPF    Bacteria, Urine None Seen None Seen /HPF    Squamous Epithelial, Urine 0 <=5 /HPF    Mucous, Urine 1+ 0 /LPF   Basic Metabolic Panel -STAT    Collection Time: 08/08/16  6:07 AM   Result Value Ref Range    Sodium 131 (L) 135 - 143 mmol/L    Potassium 4.0 3.5 - 5.1 mmol/L    Chloride 94 (L) 98 - 111 mmol/L    CO2 - Carbon Dioxide 27.0 21.0 - 31.0 mmol/L    Glucose 137 (H) 80 - 99 mg/dL    BUN 13 6 - 23 mg/dL    Creatinine 1.61 (H) 0.65 - 1.30 mg/dL    Calcium 9.2 8.6 - 09.6 mg/dL    Anion Gap 04.5 3.0 - 11.0 mmol/L    GFR Estimate 46 (L) >=60 mL/min/1.66m*2    GFR Additional Info     Lipase -STAT    Collection Time: 08/08/16  6:07 AM   Result Value Ref Range    Lipase 12 10 - 60 IU/L   CBC with Auto Differential -STAT    Collection Time: 08/08/16  6:11 AM   Result Value Ref Range    WBC 14.2 (H) 4.8 - 10.8 10*3/?L    RBC 4.80 4.70 - 6.10 10*6/?L    Hemoglobin 15.2 12.0 - 18.0 g/dL    HCT 40.9 81.1 - 91.4 %    MCV 91.7 81.0 - 99.0 fL    MCH 31.7 27.0 - 34.0 pg    MCHC 34.6 32.0 - 36.0 g/dL    RDW 78.2 95.6 - 21.3 %    Platelet Count 155 150 - 400 10*3/?L    MPV 10.7 (H) 7.4 - 10.4 fL    Neutrophils % 86 (H) 35 - 70 %    Lymphocytes % 5 (L) 25 - 45 %    Monocytes % 9 0 - 12 %    Eosinophils % 0 0 - 7 %    Basophils % 0 0 - 2 %    Neutrophils, Absolute 12.3 (H) 1.6 - 7.3 10*3/?L    Lymphocytes, Absolute 0.7 (L) 1.1 - 4.3 10*3/?L    Monocytes, Absolute 1.2 0.0 - 1.2 10*3/?L    Eosinophils, Absolute 0.0 0.0 - 0.7 10*3/?L    Basophils, Absolute 0.1 0.0 - 0.2 10*3/?L  PT & PTT -STAT    Collection Time: 08/08/16  6:11 AM   Result Value Ref Range    Protime 11.5 9.9 - 13.4 Seconds    APTT 25 23 - 36 Seconds    INR 1.0 0.9 - 1.2       CT abdomen pelvis without IV contrast   Final Result   IMPRESSION:     1. Progression of obstructive uropathy of the LEFT kidney with high-grade    obstructive changes, hydronephrosis and passage of the stone previously    identified into the LEFT ureterovesical  junction.         X-ray abdomen 2 view   Final Result   IMPRESSION:  Mildly dilated large bowel with air-fluid levels most consistent    with mild ileus or diarrheal illness.             Procedures    MDM  35 year old man returns with persistent renal colic symptoms now with a new symptom of diffuse abdominal pain and constipation. He has been taking narcotics. He has no prior abdominal surgeries and does not have intractable persistent vomiting, the last vomitus was 4 PM yesterday at my concern for small bowel obstruction is quite low. He'll have a flat and upright x-ray nonetheless to assess for constipation or new visible stone. He'll have IV fluid hydration, Pepcid and Toradol and Relistor for his likely opiate-induced constipation. He's having an chemistry and UA sent as well.    His flat and upright x-ray is quite abnormal, with a large distended loop of colon and air fluid levels noted in the right lower quadrant. On the flat view, there is obvious constipation the right lower quadrant as well and still remains an obvious distended loop of bowel in the left upper quadrant and now noted a discrete loop of bowel that looks distended in the suprapubic area concern for closed loop obstruction. He'll have a CT now, he states his pain is well-controlled after medications above.    CT scan shows a ureteral obstruction with hydronephrosis on the left and ileus. No bowel obstruction. He does have intractable vomiting, anything he puts in his mouth he ends up vomiting up. Due to the hydronephrosis and obstruction of the ureter I spoke with Dr. Shepard General, on-call urologist in [place] at [facility], who felt no emergency surgery was indicated and that his elevated creatinine was not too concerning given the short duration. Without infection on UA he felt stenting or nephrostomy tube could be performed as an outpatient. There were no beds easily available at the hospital for admission nonetheless. Given his ileus and  intractable vomiting though be admitted here to the hospitalist service. The patient agrees with the plan and the hospitalist except of the case.      ED Disposition: Admit    Diagnoses that have been ruled out:   None   Diagnoses that are still under consideration:   None   Final diagnoses:   Ileus (CMS/HCC)   Left nephrolithiasis   Hydronephrosis with urinary obstruction due to ureteral calculus   Non-intractable vomiting with nausea, unspecified vomiting type       DECISION TO ADMIT:   8/11 1127       Elmore Guise, MD  08/08/16 1213

## 2016-08-08 NOTE — ED Triage Notes (Signed)
Patient has been seen in the ED on Wednesday and Thursday and was dx with a 3 mm kidney stone on the left side, this morning the patient is c/o bilateral flank pain that is radiating to his lower abdomin, patient states he has been vomiting and cannot keep water down and he states he has not eaten in 24 hours

## 2016-08-08 NOTE — Progress Notes (Signed)
RESPIRATORY ASSESSMENT    SUBJECTIVE: pt admitted for ileus. He is a non smoker who uses no home resp meds or oxygen. He c/o pain with deep breaths due to abdominal pain.       OBJECTIVE:  Current Vital Signs:   C/S:  clear   HR:  92   RR:  16   SpO2:  98    Oxygenation Status:  Room air     ASSESSMENT:  Respiratory status is stable.       PLAN: dc rt eval and treat.       E.S.  Mathews Robinsons

## 2016-08-09 LAB — RENAL FUNCTION PANEL
Albumin: 3.3 g/dL — ABNORMAL LOW (ref 3.5–5.0)
Anion Gap: 3 mmol/L (ref 3.0–11.0)
BUN: 14 mg/dL (ref 6–23)
CO2 - Carbon Dioxide: 27 mmol/L (ref 21.0–31.0)
Calcium: 8.6 mg/dL (ref 8.6–10.3)
Chloride: 105 mmol/L (ref 98–111)
Creatinine: 1.86 mg/dL — ABNORMAL HIGH (ref 0.65–1.30)
GFR Estimate: 42 mL/min/{1.73_m2} — ABNORMAL LOW (ref >=60)
Glucose: 97 mg/dL (ref 80–99)
Phosphorus: 1.8 mg/dL — ABNORMAL LOW (ref 2.5–4.5)
Potassium: 4.1 mmol/L (ref 3.5–5.1)
Sodium: 135 mmol/L (ref 135–143)

## 2016-08-09 LAB — CBC WITH AUTO DIFFERENTIAL
Basophils %: 0 % (ref 0–2)
Basophils, Absolute: 0 10*3/ÂµL (ref 0.0–0.2)
Eosinophils %: 0 % (ref 0–7)
Eosinophils, Absolute: 0 10*3/ÂµL (ref 0.0–0.7)
HCT: 39 % — ABNORMAL LOW (ref 42.0–54.0)
Hemoglobin: 13.3 g/dL (ref 12.0–18.0)
Lymphocytes %: 10 % — ABNORMAL LOW (ref 25–45)
Lymphocytes, Absolute: 0.9 10*3/??L — ABNORMAL LOW (ref 1.1–4.3)
MCH: 31.7 pg (ref 27.0–34.0)
MCHC: 34.2 g/dL (ref 32.0–36.0)
MCV: 92.7 fL (ref 81.0–99.0)
MPV: 10.5 fL — ABNORMAL HIGH (ref 7.4–10.4)
Monocytes %: 11 % (ref 0–12)
Monocytes, Absolute: 1 10*3/ÂµL (ref 0.0–1.2)
Neutrophils %: 79 % — ABNORMAL HIGH (ref 35–70)
Neutrophils, Absolute: 7.1 10*3/ÂµL (ref 1.6–7.3)
Platelet Count: 118 10*3/??L — ABNORMAL LOW (ref 150–400)
RBC: 4.21 10*6/ÂµL — ABNORMAL LOW (ref 4.70–6.10)
RDW: 12.4 % (ref 11.5–14.5)
WBC: 9.1 10*3/ÂµL (ref 4.8–10.8)

## 2016-08-09 MED ORDER — acetaminophen (TYLENOL) tablet 650 mg
325 | ORAL | Status: DC | PRN
Start: 2016-08-09 — End: 2016-08-10
  Administered 2016-08-09: 06:00:00 325 mg via ORAL

## 2016-08-09 MED FILL — FAMOTIDINE (PF) IN 0.9% NACL 20 MG/50 ML IVPB PREMIX: 20 mg/50 mL | INTRAVENOUS | Qty: 50

## 2016-08-09 MED FILL — SODIUM CHLORIDE 0.9 % INTRAVENOUS SOLUTION: INTRAVENOUS | Qty: 1000

## 2016-08-09 MED FILL — KETOROLAC 15 MG/ML INJECTION SOLUTION: 15 mg/mL | INTRAMUSCULAR | Qty: 2

## 2016-08-09 MED FILL — ACETAMINOPHEN 325 MG TABLET: 325 mg | ORAL | Qty: 2

## 2016-08-09 MED FILL — TAMSULOSIN 0.4 MG CAPSULE: 0.4 mg | ORAL | Qty: 1

## 2016-08-09 NOTE — Progress Notes (Signed)
Daily Progress Note     Name: Manuel Perkins  DOB: 1981-10-12 35 y.o.  MRN: [id number]  CSN: [id number]    Subjective:     Manuel Perkins is a 35 y.o. male patient. Patient was admitted with Ileus (CMS/HCC)  and has had a gradually improving course since that time. The patient states that his abd pain is improving today, and that he had 3 BMs last night. He is now passing gas too. No N/V. No difficulties with urination. No other new complaints today.     Scheduled Medications:   . famotidine  20 mg Intravenous 2 times per day   . sodium chloride 0.9 % (NS) syringe  10 mL Intravenous Q8H SCH   . tamsulosin  0.4 mg Oral Daily     Infusions:   . sodium chloride 0.9 % 125 mL/hr at 08/09/16 0546     PRN Medications: acetaminophen, ketorolac, naloxone (NARCAN) injection 0.08 mg/2 mL, sodium chloride 0.9% (NS), ondansetron, sodium chloride 0.9 % (NS) syringe    Objective:     Vital Signs:    Vital Sign Ranges for Last 24 Hours:  BP  Min: 105/55  Max: 155/96  Temp  Min: 16 ?C (60.8 ?F)  Max: 38.1 ?C (100.6 ?F)  Pulse  Min: 70  Max: 101  Resp  Min: 16  Max: 20  SpO2  Min: 94 %  Max: 100 %    Most Recent Vitals  BP: 122/69  Pulse: 79  Resp: 20  Temp: 38.1 ?C (100.6 ?F)  SpO2: 94 % (08/12 0720)    Intake/Ouput:    Intake/Output Summary (Last 24 hours) at 08/09/16 0819  Last data filed at 08/09/16 0546   Gross per 24 hour   Intake             3809 ml   Output             2050 ml   Net             1759 ml        Exam:  Physical Exam   Constitutional: He is oriented to person, place, and time. He appears well-developed and well-nourished.   Neck: Normal range of motion. Neck supple.   Cardiovascular: Normal rate and regular rhythm.    Pulmonary/Chest: Effort normal and breath sounds normal.   Abdominal: Soft. Bowel sounds are normal. There is tenderness.   Neurological: He is alert and oriented to person, place, and time.   Skin: Skin is warm and dry.   Psychiatric: He has a normal mood and affect. His behavior is normal.              Recent Labs:  Results for orders placed or performed during the hospital encounter of 08/08/16 (from the past 24 hour(s))   Blood Culture -x 2    Collection Time: 08/08/16  4:13 PM   Result Value Ref Range    Blood Culture Result No Growth to Date    Blood Culture -x 2    Collection Time: 08/08/16  4:13 PM   Result Value Ref Range    Blood Culture Result No Growth to Date    Renal Function Panel -AM Draw    Collection Time: 08/09/16  7:19 AM   Result Value Ref Range    Albumin 3.3 (L) 3.5 - 5.0 g/dL    Calcium 8.6 8.6 - 95.6 mg/dL    Phosphorus 1.8 (L) 2.5 - 4.5 mg/dL    BUN 14  6 - 23 mg/dL    Creatinine 1.61 (H) 0.65 - 1.30 mg/dL    Sodium 096 045 - 409 mmol/L    Glucose 97 80 - 99 mg/dL    Potassium 4.1 3.5 - 5.1 mmol/L    Chloride 105 98 - 111 mmol/L    CO2 - Carbon Dioxide 27.0 21.0 - 31.0 mmol/L    Anion Gap 3.0 3.0 - 11.0 mmol/L    GFR Estimate 42 (L) >=60 mL/min/1.2m*2    GFR Additional Info     CBC with Auto Differential -AM Draw    Collection Time: 08/09/16  7:20 AM   Result Value Ref Range    WBC 9.1 4.8 - 10.8 10*3/?L    RBC 4.21 (L) 4.70 - 6.10 10*6/?L    Hemoglobin 13.3 12.0 - 18.0 g/dL    HCT 81.1 (L) 91.4 - 54.0 %    MCV 92.7 81.0 - 99.0 fL    MCH 31.7 27.0 - 34.0 pg    MCHC 34.2 32.0 - 36.0 g/dL    RDW 78.2 95.6 - 21.3 %    Platelet Count 118 (L) 150 - 400 10*3/?L    MPV 10.5 (H) 7.4 - 10.4 fL    Neutrophils % 79 (H) 35 - 70 %    Lymphocytes % 10 (L) 25 - 45 %    Monocytes % 11 0 - 12 %    Eosinophils % 0 0 - 7 %    Basophils % 0 0 - 2 %    Neutrophils, Absolute 7.1 1.6 - 7.3 10*3/?L    Lymphocytes, Absolute 0.9 (L) 1.1 - 4.3 10*3/?L    Monocytes, Absolute 1.0 0.0 - 1.2 10*3/?L    Eosinophils, Absolute 0.0 0.0 - 0.7 10*3/?L    Basophils, Absolute 0.0 0.0 - 0.2 10*3/?L     Pending Labs     Order Current Status    Blood Culture -x 2 Preliminary result    Blood Culture -x 2 Preliminary result    Culture, Urine -Next Routine Preliminary result        Recent Imaging:  Ct Abdomen Pelvis Without Iv  Contrast    Addendum Date: 08/08/2016    ADDENDUM #1 There is evidence of an ileus pattern of air-fluid levels in the colon, particularly the transverse colon.    Result Date: 08/08/2016  IMPRESSION:  1. Progression of obstructive uropathy of the LEFT kidney with high-grade obstructive changes, hydronephrosis and passage of the stone previously identified into the LEFT ureterovesical junction.       Assessment & Plan:     Active Hospital Problems    Diagnosis SNOMED CT(R) Date Noted   . 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 Left nephrolithiasis KIDNEY STONE 08/08/2016   . Hydronephrosis with urinary obstruction due to ureteral calculus HYDRONEPHROSIS WITH RENAL AND URETERAL CALCULOUS OBSTRUCTION 08/08/2016   . Non-intractable vomiting with nausea, unspecified vomiting type NAUSEA AND VOMITING 08/08/2016   . AKI (acute kidney injury) (CMS/HCC) INJURY OF KIDNEY 08/08/2016     1. Ileus  - Resolved  - Likely secondary to Opioid use  - Tramadol prn pain  - Patient now moving his bowels, will advance to clear liquid diet for now    ?  2. 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  - ER contacted urology in [place], Dr. Shepard [facility], on-call urologist in  [place] at [facility], who felt no emergency surgery was indicated and that his elevated creatinine was not too concerning given the short duration. Without infection on UA he felt stenting or nephrostomy tube could be performed as an outpatient  - UA negative  - Continue IVF, Flomax  - Renal function stable, if worsens will consider repeat renal US  - Tramadol prn pain  ?  3. AKI  - Secondary to #2.   - Continue to monitor for passage of stone  - Repeat BMP in am  - Continue IVF  - Will consider repeat consultation to urology tomorrow for renal function  stable/worsened     LOS: 1 day     Michele Mcalpine  08/09/2016  8:19 AM

## 2016-08-10 ENCOUNTER — Inpatient Hospital Stay: Admit: 2016-08-10 | Payer: PRIVATE HEALTH INSURANCE

## 2016-08-10 LAB — RENAL FUNCTION PANEL
Albumin: 3.1 g/dL — ABNORMAL LOW (ref 3.5–5.0)
Anion Gap: 6 mmol/L (ref 3.0–11.0)
BUN: 10 mg/dL (ref 6–23)
CO2 - Carbon Dioxide: 24 mmol/L (ref 21.0–31.0)
Calcium: 8.2 mg/dL — ABNORMAL LOW (ref 8.6–10.3)
Chloride: 111 mmol/L (ref 98–111)
Creatinine: 1.11 mg/dL (ref 0.65–1.30)
GFR Estimate: >60 mL/min/{1.73_m2} (ref >=60)
Glucose: 112 mg/dL — ABNORMAL HIGH (ref 80–99)
Phosphorus: 2 mg/dL — ABNORMAL LOW (ref 2.5–4.5)
Potassium: 3.8 mmol/L (ref 3.5–5.1)
Sodium: 141 mmol/L (ref 135–143)

## 2016-08-10 LAB — CBC WITH AUTO DIFFERENTIAL
Basophils %: 1 % (ref 0–2)
Basophils, Absolute: 0 10*3/ÂµL (ref 0.0–0.2)
Eosinophils %: 1 % (ref 0–7)
Eosinophils, Absolute: 0.1 10*3/ÂµL (ref 0.0–0.7)
HCT: 36.5 % — ABNORMAL LOW (ref 42.0–54.0)
Hemoglobin: 12.8 g/dL (ref 12.0–18.0)
Lymphocytes %: 16 % — ABNORMAL LOW (ref 25–45)
Lymphocytes, Absolute: 1.3 10*3/ÂµL (ref 1.1–4.3)
MCH: 32.3 pg (ref 27.0–34.0)
MCHC: 35 g/dL (ref 32.0–36.0)
MCV: 92.3 fL (ref 81.0–99.0)
MPV: 10 fL (ref 7.4–10.4)
Monocytes %: 9 % (ref 0–12)
Monocytes, Absolute: 0.7 10*3/ÂµL (ref 0.0–1.2)
Neutrophils %: 73 % — ABNORMAL HIGH (ref 35–70)
Neutrophils, Absolute: 5.6 10*3/ÂµL (ref 1.6–7.3)
Platelet Count: 135 10*3/??L — ABNORMAL LOW (ref 150–400)
RBC: 3.96 10*6/ÂµL — ABNORMAL LOW (ref 4.70–6.10)
RDW: 12.3 % (ref 11.5–14.5)
WBC: 7.7 10*3/ÂµL (ref 4.8–10.8)

## 2016-08-10 MED FILL — SODIUM CHLORIDE 0.9 % INTRAVENOUS SOLUTION: INTRAVENOUS | Qty: 1000

## 2016-08-10 MED FILL — FAMOTIDINE (PF) IN 0.9% NACL 20 MG/50 ML IVPB PREMIX: 20 mg/50 mL | INTRAVENOUS | Qty: 50

## 2016-08-10 MED FILL — TAMSULOSIN 0.4 MG CAPSULE: 0.4 mg | ORAL | Qty: 1

## 2016-08-10 NOTE — Discharge Summary (Signed)
Physician Discharge Summary     Patient ID:  Name: Manuel Perkins  DOB: 05-May-1981 35 y.o.  MRN: [id number]  CSN: [id number]    Admit date: 08/08/2016    Discharge date: 08/10/2016    Admission Diagnosis: Ileus (CMS/HCC) [K56.7]  Left nephrolithiasis [N20.0]  Hydronephrosis with urinary obstruction due to ureteral calculus [N13.2]  Non-intractable vomiting with nausea, unspecified vomiting type [R11.2]    Discharge Diagnoses:   Active Hospital Problems    Diagnosis SNOMED CT(R) Date Noted   . 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 Left nephrolithiasis KIDNEY STONE 08/08/2016   . Hydronephrosis with urinary obstruction due to ureteral calculus HYDRONEPHROSIS WITH RENAL AND URETERAL CALCULOUS OBSTRUCTION 08/08/2016   . Non-intractable vomiting with nausea, unspecified vomiting type NAUSEA AND VOMITING 08/08/2016   . AKI (acute kidney injury) (CMS/HCC) INJURY OF KIDNEY 08/08/2016       Significant Diagnostic Studies:   Results for orders placed or performed during the hospital encounter of 08/08/16 (from the past 24 hour(s))   CBC with Auto Differential -Daily    Collection Time: 08/10/16  6:34 AM   Result Value Ref Range    WBC 7.7 4.8 - 10.8 10*3/?L    RBC 3.96 (L) 4.70 - 6.10 10*6/?L    Hemoglobin 12.8 12.0 - 18.0 g/dL    HCT 08.6 (L) 57.8 - 54.0 %    MCV 92.3 81.0 - 99.0 fL    MCH 32.3 27.0 - 34.0 pg    MCHC 35.0 32.0 - 36.0 g/dL    RDW 46.9 62.9 - 52.8 %    Platelet Count 135 (L) 150 - 400 10*3/?L    MPV 10.0 7.4 - 10.4 fL    Neutrophils % 73 (H) 35 - 70 %    Lymphocytes % 16 (L) 25 - 45 %    Monocytes % 9 0 - 12 %    Eosinophils % 1 0 - 7 %    Basophils % 1 0 - 2 %    Neutrophils, Absolute 5.6 1.6 - 7.3 10*3/?L    Lymphocytes, Absolute 1.3 1.1 - 4.3 10*3/?L    Monocytes, Absolute 0.7 0.0 - 1.2 10*3/?L    Eosinophils, Absolute 0.1 0.0 - 0.7 10*3/?L    Basophils, Absolute 0.0 0.0 - 0.2 10*3/?L    Differential Type Automated Differential    Renal Function Panel  -Daily    Collection Time: 08/10/16  6:34 AM   Result Value Ref Range    Albumin 3.1 (L) 3.5 - 5.0 g/dL    Calcium 8.2 (L) 8.6 - 10.3 mg/dL    Phosphorus 2.0 (L) 2.5 - 4.5 mg/dL    BUN 10 6 - 23 mg/dL    Creatinine 4.13 2.44 - 1.30 mg/dL    Sodium 010 272 - 536 mmol/L    Glucose 112 (H) 80 - 99 mg/dL    Potassium 3.8 3.5 - 5.1 mmol/L    Chloride 111 98 - 111 mmol/L    CO2 - Carbon Dioxide 24.0 21.0 - 31.0 mmol/L    Anion Gap 6.0 3.0 - 11.0 mmol/L    GFR Estimate >60 >=60 mL/min/1.63m*2    GFR Additional Info       X-ray Chest Pa And Lateral    Result Date: 08/10/2016  Impression IMPRESSION:  Mild RIGHT basilar atelectasis.     Consults:   None    Physical Exam:   Physical Exam   Constitutional: He is oriented to person, place,  and time. He appears well-developed and well-nourished.   HENT:   Head: Normocephalic.   Neck: Normal range of motion. Neck supple.   Cardiovascular: Normal rate, regular rhythm and normal heart sounds.    Pulmonary/Chest: Effort normal and breath sounds normal.   Abdominal: Soft. Bowel sounds are normal.   Neurological: He is alert and oriented to person, place, and time.   Skin: Skin is warm and dry.   Psychiatric: He has a normal mood and affect. His behavior is normal.       Hospital Course: Manuel Perkins is a 35 y.o. male who presented for evaluation of abd pain, nausea and vomiting.  The patient was seen in the ER the past couple of day for a kidney stone, he has been taking narcotics because of the pain he has been in. His last BM was Wednesday before admission.  The patient presented to the ER because he could not keep any food/liquid down. He was found to have an ileus. He was made NPO and started on IVF, tramadol for pain. The patient moved his bowels several times the night after admission, and started to pass gas. His diet was slowly advanced which he tolerated. The patient also had AKI upon admission secondary to Nephrolithiasis with obstructive uropathy of the LEFT kidney with  high-grade obstructive changes, hydronephrosis and passage of the stone previously identified into the LEFT ureterovesical junction. The ER contacted urology in [place], Dr. Shepard [facility], on-call urologist in [place] at [facility], who felt no emergency surgery was indicated and that his elevated creatinine was not too concerning given the short duration. Without infection on UA he felt stenting or nephrostomy tube could be performed as an outpatient. The patient's UA was negative. Renal function improved with IVF during this admission. The patient was discharged in stable condition on 08/10/16. He will obtain repeat BMP in 1-2 days, and followup with the transitional care clinic within 3 days. The patient has not yet passed his kidney stone that he knows of.     Discharge Medications:     Patient's Discharge Medication List      TAKE these medications       Indications of Use    naproxen 500 MG tablet   Quantity:  20 tablet   Commonly known as:  NAPROSYN   Take 1 tablet by mouth 2 times daily with meals for 20 doses.   Notes to Patient:  Next dose due tonight with dinner.        oxyCODONE 10 mg Tab immediate release tablet   Quantity:  20 tablet   What changed:  Another medication with the same name was removed. Continue taking this medication, and follow the directions you see here.   Take 1 tablet by mouth every 4 hours for 20 doses.   Notes to Patient:  As needed.        promethazine 25 MG tablet   Quantity:  30 tablet   Commonly known as:  PHENERGAN   Take 1 tablet by mouth every 6 hours as needed for Nausea for up to 12 doses.   Notes to Patient:  As needed for nausea.        tamsulosin 0.4 mg Cp24   Quantity:  30 capsule   Commonly known as:  FLOMAX   Take 1 capsule by mouth daily.   Notes to Patient:  Next dose due tomorrow AM.              Patient Instructions/Follow-up Appointments:  Pending Labs     Order Current Status    Blood Culture -x 2 Preliminary result    Blood Culture -x 2 Preliminary result             Activity: activity as tolerated  Diet: regular diet  Discharge Disposition: To home on independent care  Follow-up with TCC in 3 days.     LOS: 2 days   Time spent on discharge: 35 mins    Signed:  Michele Mcalpine  08/10/2016  6:16 PM

## 2016-08-11 ENCOUNTER — Other Ambulatory Visit: Admit: 2016-08-11 | Discharge: 2016-08-11 | Payer: PRIVATE HEALTH INSURANCE

## 2016-08-11 DIAGNOSIS — N179 Acute kidney failure, unspecified: Secondary | ICD-10-CM

## 2016-08-11 LAB — BASIC METABOLIC PANEL
Anion Gap: 9 mmol/L (ref 3.0–11.0)
BUN: 15 mg/dL (ref 6–23)
CO2 - Carbon Dioxide: 29 mmol/L (ref 21.0–31.0)
Calcium: 9.5 mg/dL (ref 8.6–10.3)
Chloride: 106 mmol/L (ref 98–111)
Creatinine: 1.11 mg/dL (ref 0.65–1.30)
GFR Estimate: >60 mL/min/{1.73_m2} (ref >=60)
Glucose: 70 mg/dL — ABNORMAL LOW (ref 80–99)
Potassium: 4.2 mmol/L (ref 3.5–5.1)
Sodium: 144 mmol/L — ABNORMAL HIGH (ref 135–143)

## 2016-08-12 NOTE — Telephone Encounter (Signed)
Attempted to call patient about recent hospitalization. Patient did not answer, will attempt again. LVM with my phone #.

## 2016-08-12 NOTE — Telephone Encounter (Signed)
LVM for pt regarding recent hospitalization and upcoming TCM appt scheduled in [facility] on 08/13/2016. I asked that he return my call if he has any questions, concerns, or if he needs to make any changes to his appt.

## 2016-08-13 ENCOUNTER — Encounter: Admit: 2016-08-13 | Discharge: 2016-09-18 | Payer: PRIVATE HEALTH INSURANCE | Attending: Primary Care

## 2016-08-13 DIAGNOSIS — N139 Obstructive and reflux uropathy, unspecified: Secondary | ICD-10-CM

## 2016-08-13 NOTE — Telephone Encounter (Signed)
Attempted to call patient about recent hospitalization. Patient answered but was just arriving to [facility] appt. Encounter closed per protocol.

## 2016-08-13 NOTE — Progress Notes (Signed)
Subjective   Chief Complaint:  Chief Complaint   Patient presents with   . Transition Care Management (Tcm)     ileus, kidney stone, AKI       History of Present Illness:  Hospital Follow-Up: Manuel Perkins is a 35 y.o. male who is here today after being discharged from [facility]. He was discharged 3 days ago. The main problem requiring admission was Obstructive Uropathy 2nd L Nephrolithiasis, AKI which resolved, Subsequent Ileus with Narcotics, resolved. The Discharge summary and/or Transition of Care Management document was reviewed. Medication reconciliation has been performed as indicated below. The day 2 post Discharge Transitional Care Management encounter was reviewed.     Post discharge course:  Since discharge pt has been feeling much better. He is eating and drinking extra fluid. He has not taken his Flomax since discharge. He had some mild 1 second L Flank Pain before this appt. He is urinating normally, no hematuria. No ABD Pain, N/V. He is having normal BM's.      Objective   Vitals:    08/13/16 1310   BP: 132/82   Pulse: 72   Temp: 36.7 ?C (98 ?F)   SpO2: 98%     Body mass index is 27.46 kg/(m^2).  History   Smoking Status   . Never Smoker   Smokeless Tobacco   . Former Neurosurgeon       Current Outpatient Prescriptions:   .  naproxen (NAPROSYN) 500 MG tablet, Take 1 tablet by mouth 2 times daily with meals for 20 doses. (Patient not taking: Reported on 08/13/2016), Disp: 20 tablet, Rfl: 0  .  promethazine (PHENERGAN) 25 MG tablet, Take 1 tablet by mouth every 6 hours as needed for Nausea for up to 12 doses. (Patient not taking: Reported on 08/13/2016), Disp: 30 tablet, Rfl: 0  .  tamsulosin (FLOMAX) 0.4 mg Cp24, Take 1 capsule by mouth daily. (Patient not taking: Reported on 08/13/2016), Disp: 30 capsule, Rfl: 0  Review of Systems   Constitutional: Negative for chills and fever.   Eyes: Negative for blurred vision.   Respiratory: Negative for cough.    Cardiovascular: Negative for chest  pain, palpitations and leg swelling.   Gastrointestinal: Negative for abdominal pain, heartburn, nausea and vomiting.   Genitourinary: Negative for dysuria, frequency, hematuria and urgency.   Skin: Negative for rash.   Neurological: Negative for dizziness, weakness and headaches.     Physical Exam   Constitutional: He is oriented to person, place, and time and well-developed, well-nourished, and in no distress.   HENT:   Head: Atraumatic.   Eyes: EOM are normal.   Neck: No JVD present.   Cardiovascular: Normal rate and regular rhythm.    Pulmonary/Chest: Effort normal and breath sounds normal.   Abdominal: Soft. Bowel sounds are normal.   Genitourinary:   Genitourinary Comments: Neg CVA   Musculoskeletal: Normal range of motion. He exhibits no edema or tenderness.   Neurological: He is alert and oriented to person, place, and time. No cranial nerve deficit.   Skin: Skin is warm and dry.   Psychiatric: Affect normal.   Vitals reviewed.         Assessment     ICD-9-CM ICD-10-CM   1. Obstructive uropathy 599.60 N13.9   2. Hydronephrosis with urinary obstruction due to ureteral calculus 592.1 N13.2    591    3. AKI (acute kidney injury) (CMS/HCC) 584.9 N17.9   4. Ileus (CMS/HCC) 560.1 K56.7  Plan: Pt encouraged to start Flomax, rationale provided. Push Po fluids.       Follow up: PCP, Urology.       TRANSITIONAL CARE MANAGEMENT CERTIFICATION:  I certify the following are true:  1. Communication and/or attempts to communicate with the patient was made within 2 business days of discharge.  2. Complexity of Medical decision making is moderate.  3. Face to face visit occurred within 3 days of discharge.

## 2016-08-13 NOTE — Discharge Instructions (Signed)
Continue taking tamsulosin (flomax) as directed until kidney stone passes.

## 2018-02-02 ENCOUNTER — Emergency Department: Admit: 2018-02-02

## 2018-02-02 ENCOUNTER — Inpatient Hospital Stay: Admit: 2018-02-02 | Discharge: 2018-02-02 | Disposition: A | Discharge status: Home / Self Care | Attending: MD

## 2018-02-02 DIAGNOSIS — N132 Hydronephrosis with renal and ureteral calculous obstruction: Secondary | ICD-10-CM

## 2018-02-02 LAB — COMPREHENSIVE METABOLIC PANEL
ALT - Alanine Aminotransferase: 85 IU/L — ABNORMAL HIGH (ref 7–52)
AST - Aspartate Aminotransferase: 46 IU/L (ref 10–50)
Albumin/Globulin Ratio: 1.6 (ref >0.9)
Albumin: 4 g/dL (ref 3.5–5.0)
Alkaline Phosphatase: 49 IU/L (ref 34–104)
Anion Gap: 9 mmol/L (ref 4–13)
BUN: 21 mg/dL (ref 6–23)
Bilirubin Total: 0.3 mg/dL (ref 0.3–1.2)
CO2 - Carbon Dioxide: 25 mmol/L (ref 21–31)
Calcium: 9 mg/dL (ref 8.6–10.3)
Chloride: 105 mmol/L (ref 98–111)
Creatinine: 1.22 mg/dL (ref 0.65–1.30)
GFR Estimate: >60 mL/min/{1.73_m2} (ref >=60)
Globulin: 2.5 g/dL (ref 2.2–3.7)
Glucose: 110 mg/dL — ABNORMAL HIGH (ref 80–99)
Potassium: 3.6 mmol/L (ref 3.5–5.1)
Protein Total: 6.5 g/dL (ref 6.0–8.0)
Sodium: 139 mmol/L (ref 135–143)

## 2018-02-02 LAB — CBC WITH AUTO DIFFERENTIAL
Bands %: 3 % (ref 0–10)
Bands, Absolute: 0.1 10*3/ÂµL (ref 0.0–1.2)
Basophils %: 1 % (ref 0–2)
Basophils, Absolute: 0 10*3/ÂµL (ref 0.0–0.2)
Eosinophils %: 0 % (ref 0–7)
Eosinophils, Absolute: 0 10*3/ÂµL (ref 0.0–0.7)
HCT: 42 % (ref 42.0–54.0)
Hemoglobin: 14.5 g/dL (ref 12.0–18.0)
Lymphocytes %: 34 % (ref 25–45)
Lymphocytes, Absolute: 1.3 10*3/ÂµL (ref 1.1–4.3)
MCH: 32.8 pg (ref 27.0–34.0)
MCHC: 34.6 g/dL (ref 32.0–36.0)
MCV: 94.8 fL (ref 81.0–99.0)
MPV: 9.7 fL (ref 7.4–10.4)
Monocytes %: 13 % — ABNORMAL HIGH (ref 0–12)
Monocytes, Absolute: 0.5 10*3/ÂµL (ref 0.0–1.2)
Neutrophils %: 49 % (ref 35–70)
Neutrophils, Absolute: 1.9 10*3/ÂµL (ref 1.6–7.3)
Platelet Count: 148 10*3/ÂµL — ABNORMAL LOW (ref 150–400)
Platelet Estimate: NORMAL
RBC Morphology: NORMAL
RBC: 4.43 10*6/ÂµL — ABNORMAL LOW (ref 4.70–6.10)
RDW: 13.1 % (ref 11.5–14.5)
WBC: 3.8 10*3/ÂµL — ABNORMAL LOW (ref 4.8–10.8)

## 2018-02-02 LAB — UA WITH AUTO MICRO
Ascorbic Acid, Urine: NEGATIVE
Bacteria, Urine: NONE SEEN /HPF
Bilirubin, Urine: NEGATIVE
Glucose, Urine: NEGATIVE mg/dL
Ketones, Urine: NEGATIVE mg/dL
Leukocyte Esterase, Urine: NEGATIVE
Nitrite, Urine: NEGATIVE
Protein, Urine: NEGATIVE mg/dL
RBC, Urine: 88 /HPF — ABNORMAL HIGH (ref <=5)
Specific Gravity, Urine: 1.016 (ref 1.003–1.030)
Squamous Epithelial, Urine: 0 /HPF (ref <=5)
Urobilinogen, Urine: NORMAL mg/dL
White Blood Cell Microscopic Numeric: 3 /HPF (ref <=9)
pH, UA: 6 (ref 5.0–7.5)

## 2018-02-02 MED ORDER — sodium chloride (NS) 0.9% bolus 1,000 mL
Freq: Once | INTRAVENOUS | Status: AC
Start: 2018-02-02 — End: 2018-02-02
  Administered 2018-02-02 (×2): via INTRAVENOUS

## 2018-02-02 MED ORDER — ondansetron (ZOFRAN) injection 4 mg
4 | INTRAMUSCULAR | Status: AC | PRN
Start: 2018-02-02 — End: 2018-02-02
  Administered 2018-02-02 (×2): 4 mg via INTRAVENOUS

## 2018-02-02 MED ORDER — HYDROmorphone (PF) (DILAUDID) injection 1 mg
1 | INTRAMUSCULAR | Status: AC | PRN
Start: 2018-02-02 — End: 2018-02-02
  Administered 2018-02-02 (×2): 1 mg via INTRAVENOUS

## 2018-02-02 MED ORDER — ondansetron 4 mg disintegrating tablet (starter pack)(6 tab)
4 | Freq: Three times a day (TID) | ORAL | Status: DC | PRN
Start: 2018-02-02 — End: 2018-02-02
  Administered 2018-02-02: 13:00:00 4 mg via ORAL

## 2018-02-02 MED ORDER — ketorolac (TORADOL) 15 mg/mL injection 15 mg
15 | Freq: Once | INTRAMUSCULAR | Status: AC
Start: 2018-02-02 — End: 2018-02-02
  Administered 2018-02-02: 13:00:00 15 mg via INTRAVENOUS

## 2018-02-02 MED ORDER — oxyCODONE (ROXICODONE) 5mg tablet (starter pack)(6 tabs)
5 | ORAL | Status: DC | PRN
Start: 2018-02-02 — End: 2018-02-02
  Administered 2018-02-02: 13:00:00 5 mg via ORAL

## 2018-02-02 MED ORDER — tamsulosin (FLOMAX) 0.4 mg Cap
0.4 | ORAL_CAPSULE | Freq: Every day | ORAL | 0 refills | 90.00000 days | Status: AC
Start: 2018-02-02 — End: 2018-02-09

## 2018-02-02 MED FILL — ONDANSETRON HCL (PF) 4 MG/2 ML INJECTION SOLUTION: 4 | INTRAMUSCULAR | Qty: 2

## 2018-02-02 MED FILL — SODIUM CHLORIDE 0.9 % INTRAVENOUS SOLUTION: 1000.0000 mL | INTRAVENOUS | Qty: 1000

## 2018-02-02 MED FILL — KETOROLAC 15 MG/ML INJECTION SOLUTION: 15 mg/mL | INTRAMUSCULAR | Qty: 15

## 2018-02-02 MED FILL — OXYCODONE 5 MG TABLET (#6)(SP): 5 mg | ORAL | Qty: 0.17

## 2018-02-02 MED FILL — HYDROMORPHONE (PF) 1 MG/ML INJECTION SOLUTION: 1 mg/mL | INTRAMUSCULAR | Qty: 1

## 2018-02-02 MED FILL — ONDANSETRON 4 MG DISINTEGRATING TABLET (#6)(SP): 4 mg | ORAL | Qty: 1

## 2018-02-02 NOTE — ED Provider Notes (Signed)
History   Manuel Perkins is a 37 y.o. year old male presenting with Flank Pain  .    HPI  37 year old man presents to the ER for evaluation of left-sided flank pain radiating to the groin that started gradually this morning and is now moderate in intensity. Describes the pain as waxing and waning. He does have a history of a kidney stone. He also reports returning from a five-day music festival in which she was drinking heavily in [place] last week. From [place] he went to [facility] and then on the flight back to [place] on Saturday he had some stomach upset and vomiting that he thought was related to either food or hangover. His symptoms mostly resolved over the weekend and then started with the flank pain today. Denies dysuria, hematuria. Denies testicular tenderness.    No past medical history on file.    No past surgical history on file.    No family history on file.    Social History   Substance Use Topics   . Smoking status: Never Smoker   . Smokeless tobacco: Former Neurosurgeon   . Alcohol use Yes      Comment: 1-3 wk     Other Social History Comments:       Home Medications         Last Dose     tamsulosin (FLOMAX) 0.4 mg Cp24 (Expired)      Take 1 capsule by mouth daily.     Patient not taking:  Reported on 08/13/2016          Review of Systems  Pertinent review of systems documented in the history of present illness. All other systems reviewed and negative.    Physical Exam   BP 141/84   Pulse 76   Temp 36.7 ?C (98 ?F) (Oral)   Resp 18   SpO2 95%     Physical Exam   Constitutional: He is oriented to person, place, and time. No distress.   HENT:   Head: Normocephalic and atraumatic.   Eyes: Pupils are equal, round, and reactive to light. EOM are normal.   Neck: Normal range of motion. Neck supple.   Cardiovascular: Intact distal pulses.    Pulmonary/Chest: Effort normal. No stridor.   Abdominal: Soft.   Mild discomfort to deep palpation diffusely   Genitourinary:   Genitourinary Comments: Testicles are in  normal lie and nontender. Epididymis is nontender. Penis is normal.   Musculoskeletal: Normal range of motion.   Neurological: He is alert and oriented to person, place, and time.   Skin: Skin is warm and dry. He is not diaphoretic.   Psychiatric: He has a normal mood and affect.   Nursing note and vitals reviewed.      ED Course     Results for orders placed or performed during the hospital encounter of 02/02/18 (from the past 24 hour(s))   CBC with Auto Differential -STAT    Collection Time: 02/02/18  3:25 AM   Result Value Ref Range    WBC 3.8 (L) 4.8 - 10.8 10*3/?L    RBC 4.43 (L) 4.70 - 6.10 10*6/?L    Hemoglobin 14.5 12.0 - 18.0 g/dL    HCT 09.8 11.9 - 14.7 %    MCV 94.8 81.0 - 99.0 fL    MCH 32.8 27.0 - 34.0 pg    MCHC 34.6 32.0 - 36.0 g/dL    RDW 82.9 56.2 - 13.0 %    Platelet Count 148 (L) 150 -  400 10*3/?L    MPV 9.7 7.4 - 10.4 fL    Neutrophils % 49 35 - 70 %    Bands % 3 0 - 10 %    Lymphocytes % 34 25 - 45 %    Monocytes % 13 (H) 0 - 12 %    Eosinophils % 0 0 - 7 %    Basophils % 1 0 - 2 %    Neutrophils, Absolute 1.9 1.6 - 7.3 10*3/?L    Bands, Absolute 0.1 0.0 - 1.2 10*3/?L    Lymphocytes, Absolute 1.3 1.1 - 4.3 10*3/?L    Monocytes, Absolute 0.5 0.0 - 1.2 10*3/?L    Eosinophils, Absolute 0.0 0.0 - 0.7 10*3/?L    Basophils, Absolute 0.0 0.0 - 0.2 10*3/?L    Differential Type Manual Differential     Platelet Estimate Normal Normal    RBC Morphology Normal    Comprehensive Metabolic Panel -STAT    Collection Time: 02/02/18  3:25 AM   Result Value Ref Range    Sodium 139 135 - 143 mmol/L    Potassium 3.6 3.5 - 5.1 mmol/L    Chloride 105 98 - 111 mmol/L    CO2 - Carbon Dioxide 25 21 - 31 mmol/L    Glucose 110 (H) 80 - 99 mg/dL    BUN 21 6 - 23 mg/dL    Creatinine 1.61 0.96 - 1.30 mg/dL    Calcium 9.0 8.6 - 04.5 mg/dL    AST - Aspartate Aminotransferase 46 10 - 50 IU/L    ALT - Alanine Aminotransferase 85 (H) 7 - 52 IU/L    Alkaline Phosphatase 49 34 - 104 IU/L    Bilirubin Total 0.3 0.3 - 1.2 mg/dL     Protein Total 6.5 6.0 - 8.0 g/dL    Albumin 4.0 3.5 - 5.0 g/dL    Globulin 2.5 2.2 - 3.7 g/dL    Albumin/Globulin Ratio 1.6 >0.9    Anion Gap 9 4 - 13 mmol/L    GFR Estimate >60 >=60 mL/min/1.44m*2    GFR Additional Info     Urinalysis with Microscopic -Once    Collection Time: 02/02/18  4:16 AM   Result Value Ref Range    Color, Urine Yellow Yellow    Clarity, Urine Clear Clear    Glucose, Urine Negative Negative mg/dL    Bilirubin, Urine Negative Negative    Ketones, Urine Negative Negative mg/dL    Specific Gravity, Urine 1.016 1.003 - 1.030    Blood, Urine 3+ (A) Negative    pH, UA 6.0 5.0 - 7.5    Protein, Urine Negative Negative mg/dL    Urobilinogen, Urine Normal Normal mg/dL    Nitrite, Urine Negative Negative    Leukocyte Esterase, Urine Negative Negative    Ascorbic Acid, Urine Negative Negative    White Blood Cell Microscopic Numeric 3 <=9 /HPF    RBC, Urine 88 (H) <=5 /HPF    Bacteria, Urine None Seen None Seen /HPF    Squamous Epithelial, Urine 0 <=5 /HPF    Mucous, Urine 1+ 0 /LPF       CT renal colic abd pelvis without contrast    (Results Pending)       ED Course as of Feb 03 516   Tue Feb 02, 2018   0401 My preliminary interpretation of the CAT scan is moderate sized left-sided ureteral stone with mild hydronephrosis    0442 Urinalysis shows no evidence of UTI but numerous RBCs consistent with  ureterolithiasis. CBC and metabolic panel unremarkable. Renal function is normal.    0515 I explained the diagnosis of left ureterolithiasis to the patient and his girlfriend. His pain is much improved and he is requesting discharge. I provided him with medication for pain and nausea as well as Flomax. Given him follow-up with urology. On final reevaluation I explained that few things in medicine are black and white or 100% and also that disease processes are dynamic. As such, I emphasized the following critical advice: 1) outpatient follow-up is imperitive, and 2) immediate return to the ER is critically  important if symptoms worsen, progress, or change unexpectedly. I went further and stated that if a concern develops that is not clearly addressed in written discharge instructions, they should either call the ER immediately or return in person for help. The above was verbalized back to me, demonstrating understanding.             [id number] - MEDICATIONS ADMINISTERED-LAST 24 Hours  (last 24 hrs)         ** SITE UNKNOWN **       Medication Name Action Time Action Route Dose     HYDROmorphone (PF) (DILAUDID) injection 1 mg 02/02/18 0335 Given Intravenous 1 mg     HYDROmorphone (PF) (DILAUDID) injection 1 mg 02/02/18 8657 Given Intravenous 1 mg     ketorolac (TORADOL) 15 mg/mL injection 15 mg 02/02/18 8469 Given Intravenous 15 mg     ondansetron (ZOFRAN) injection 4 mg 02/02/18 0335 Given Intravenous 4 mg     ondansetron (ZOFRAN) injection 4 mg 02/02/18 6295 Given Intravenous 4 mg     sodium chloride (NS) 0.9% bolus 1,000 mL 02/02/18 0335 New Bag Intravenous 1,000 mL     sodium chloride (NS) 0.9% bolus 1,000 mL 02/02/18 0427 Stopped Intravenous                 Procedures    MDM           ED Disposition: Discharge    Diagnoses that have been ruled out:   None   Diagnoses that are still under consideration:   None   Final diagnoses:   Ureterolithiasis       New Prescriptions         Start     tamsulosin (FLOMAX) 0.4 mg Cap  Daily     Sig: Take 1 capsule by mouth daily for 7 days. Take 30 minutes after the same meal each day.     02/02/18 0000          [facility] [place]  [address]  [address] [place] [address]  [phone]  Schedule an appointment as soon as possible for a visit in 1 week      [facility]  [address]  [place] [address]  [phone]  Go today  If symptoms worsen      Contact information for follow-up            [facility]   Specialty:  Urology    [address] [place]  [address]  [place] [address]   Phone:  [phone]       Next Steps:   Schedule an appointment as soon as possible for a visit in 1 week(s)    [facility]   Specialty:  Emergency Medicine    [address]  [place] [address]   Phone:  [phone]  Next Steps:  Go today    Instructions:  If symptoms worsen                 Estelle June, MD  02/02/18 [phone]

## 2018-02-02 NOTE — Discharge Instructions (Signed)
Patient Education     Kidney Stones  Kidney stones (urolithiasis) are rock-like masses that form inside of the kidneys. Kidneys are organs that make pee (urine). A kidney stone can cause very bad pain and can block the flow of pee. The stone usually leaves your body (passes) through your pee. You may need to have a doctor take out the stone.  Follow these instructions at home:  Eating and drinking   · Drink enough fluid to keep your pee clear or pale yellow. This will help you pass the stone.  · If told by your doctor, change the foods you eat (your diet). This may include:  ? Limiting how much salt (sodium) you eat.  ? Eating more fruits and vegetables.  ? Limiting how much meat, poultry, fish, and eggs you eat.  · Follow instructions from your doctor about eating or drinking restrictions.  General instructions   · Collect pee samples as told by your doctor. You may need to collect a pee sample:  ? 24 hours after a stone comes out.  ? 8-12 weeks after a stone comes out, and every 6-12 months after that.  · Strain your pee every time you pee (urinate), for as long as told. Use the strainer that your doctor recommends.  · Do not throw out the stone. Keep it so that it can be tested by your doctor.  · Take over-the-counter and prescription medicines only as told by your doctor.  · Keep all follow-up visits as told by your doctor. This is important. You may need follow-up tests.  Preventing kidney stones   To prevent another kidney stone:  · Drink enough fluid to keep your pee clear or pale yellow. This is the best way to prevent kidney stones.  · Eat healthy foods.  · Avoid certain foods as told by your doctor. You may be told to eat less protein.  · Stay at a healthy weight.    Contact a doctor if:  · You have pain that gets worse or does not get better with medicine.  Get help right away if:  · You have a fever or chills.  · You get very bad pain.  · You get new pain in your belly (abdomen).  · You pass out  (faint).  · You cannot pee.  This information is not intended to replace advice given to you by your health care provider. Make sure you discuss any questions you have with your health care provider.  Document Released: 06/02/2008 Document Revised: 09/02/2016 Document Reviewed: 09/02/2016  Elsevier Interactive Patient Education © 2017 [employer]

## 2018-02-02 NOTE — ED Triage Notes (Signed)
BIB [facility]. Cc left flank pain, testicular pain starting Saturday. Pt reportedly flew back from [place] on Saturday began having left flank pain, bloating, and vomiting. Pt also states his left testicle started hurting today.

## 2018-02-03 ENCOUNTER — Ambulatory Visit: Admit: 2018-02-03 | Discharge: 2018-02-03 | Payer: PRIVATE HEALTH INSURANCE | Attending: MD

## 2018-02-03 DIAGNOSIS — N201 Calculus of ureter: Secondary | ICD-10-CM

## 2018-02-03 MED ORDER — HYDROcodone-acetaminophen (NORCO) 5-325 mg per tablet
5-325 | ORAL_TABLET | Freq: Four times a day (QID) | ORAL | 0 refills | 30.00000 days | Status: AC | PRN
Start: 2018-02-03 — End: ?

## 2018-02-03 NOTE — Progress Notes (Signed)
Referring Physician:   Estelle June, MD    Chief Complaint:  Ureterolithiasis        HISTORY OF PRESENT ILLNESS     HPI  Manuel Perkins had a 3 mm Ureteral calculus that was initially located near the left UPJ on a CAT scan dated 08/06/2016. A follow-up scan CAT scan on 08/08/2016 showed that the stone it moved approximately 1 cm proximal to the left ureteral vesicle junction. He became pain-free and thought he had passed the stone. He was seen in the emergency room on 02/02/2018 and had a renal another CAT scan that showed that the stone located 1 cm proximal to his left ureterovesical junction had grown in size from 3 mm to 5 mm.he was only given enough pain medicine for 24 hours after his discharge and continues to have left-sided flank pain. He denies any nausea vomiting or fever or chills. He states he drinks large amounts of water and adds lemon to his water. He denies eating salty food. Other than the left ureteral calculus that he is currently dealing with he has not had any other kidney stones. There is no family history of kidney stones.  Past Medical History:   Diagnosis Date   . Kidney stone      Medications:   Outpatient Prescriptions Marked as Taking for the 02/03/18 encounter (Office Visit) with Ella Jubilee, MD   Medication Sig Dispense Refill   . tamsulosin (FLOMAX) 0.4 mg Cap Take 1 capsule by mouth daily for 7 days. Take 30 minutes after the same meal each day. 7 capsule 0     Allergies: Patient has no known allergies.    Past Surgical History:   Procedure Laterality Date   . SHOULDER SURGERY       Family History   Problem Relation Age of Onset   . Aneurysm Father    . Alzheimer's disease Paternal Grandfather      Social History     Social History   . Marital status: Single     Spouse name: N/A   . Number of children: N/A   . Years of education: N/A     Occupational History   . Not on file.     Social History Main Topics   . Smoking status: Never Smoker   . Smokeless tobacco: Former Neurosurgeon   . Alcohol use Yes      Comment: 1-3 wk   . Drug use: No   . Sexual activity: Yes     Partners: Female     Other Topics Concern   . Not on file     Social History Narrative   . No narrative on file       History   Smoking Status   . Never Smoker   Smokeless Tobacco   . Former Neurosurgeon        Reviewed medical, surgical, family and social histories today with the patient.     REVIEW OF SYSTEMS     Review of Systems   Constitutional:        Pt had fever 2 days ago.   HENT:        Nasal congestion   Genitourinary: Positive for flank pain.   All other systems reviewed and are negative.     12 point  AMA review of systems negative except as noted above.    PHYSICAL EXAM     Temp 36.5 ?C (97.7 ?F) (Tympanic)   Ht 5' 8 (1.727 m)   Wt  180 lb (81.6 kg)   BMI 27.37 kg/m?   Body mass index is 27.37 kg/m?Marland Kitchen    Physical Exam   Constitutional: He is oriented to person, place, and time. He appears well-developed and well-nourished.   HENT:   Head: Normocephalic.   Right Ear: External ear normal.   Left Ear: External ear normal.   Eyes: EOM are normal. No scleral icterus.   Neck: Normal range of motion. No tracheal deviation present.   Cardiovascular: Normal rate and regular rhythm.    Pulmonary/Chest: Effort normal and breath sounds normal.   Musculoskeletal: Normal range of motion.   Neurological: He is alert and oriented to person, place, and time.   Skin: Skin is warm and dry.   Psychiatric: He has a normal mood and affect. His behavior is normal.         Labs:    Sodium   Date Value Ref Range Status   02/02/2018 139 135 - 143 mmol/L Final     Potassium   Date Value Ref Range Status   02/02/2018 3.6 3.5 - 5.1 mmol/L Final     Chloride   Date Value Ref Range Status   02/02/2018 105 98 - 111 mmol/L Final     CO2 - Carbon Dioxide   Date Value Ref Range Status   02/02/2018 25 21 - 31 mmol/L Final     Anion Gap   Date Value Ref Range Status   02/02/2018 9 4 - 13 mmol/L Final     Calcium   Date Value Ref Range Status   02/02/2018 9.0 8.6 - 10.3 mg/dL Final       BUN   Date Value Ref Range Status   02/02/2018 21 6 - 23 mg/dL Final     GFR Estimate   Date Value Ref Range Status   02/02/2018 >60 >=60 mL/min/1.45m*2 Final     Glucose   Date Value Ref Range Status   02/02/2018 110 (H) 80 - 99 mg/dL Final       Lab Results   Component Value Date    PROTEINUR Negative 02/02/2018       No results found for: PSA, PSAFREE, PSAFREEPCT, PSASCREENING    Imaging:    I personally reviewed the CAT scan from 08/06/2016, 08/08/2016 and 02/02/2018. I agree with the radiologist's interpretation  Ct Renal Colic Abd Pelvis Without Contrast    Result Date: 02/02/2018  IMPRESSION:  4 mm obstructing calculus LEFT ureterovesical junction. Mild associated left-sided hydronephrosis. Hepatic steatosis.          Impression:      URETEROSCOPY WITH LASER LITHOTRIPSY AND STONE EXTRACTION :    The stone that is proximal to his left UVJ I believe is the same stone that was there on 08/08/2016. I believe it's most likely encased in edema. Accordingly I do not believe that he'll pass his stone and furthermore I did not believe that ESWL is the best option for treating his stone since I do not believe the fragments would pass since her most likely encased in the ureter.  The risks, benefits and possible complications of the proposed procedure were discussed with the patient lengthened in detail.  The alternatives to the proposed treatment were discussed at length with the patient as well. Potential complications that were discussed included but were not limited to bleeding, infection, injury to the ureter, failure to sufficiently fragment or extract the stone or completely remove the stones, need for additional surgery, need for a ureteral stent.  All patient's questions were answered and the patient voiced understanding of these risks, benefits and complications. The patient gave fully informed consent proceed with procedure.      1. Ureteral calculus, left  - HYDROcodone-acetaminophen (NORCO) 5-325 mg per  tablet; Take 1 tablet by mouth every 6 hours as needed for Pain (severe pain).  Dispense: 10 tablet; Refill: 0      There are no discontinued medications.    No Follow-up on file.      The patient verbalized understanding of the plan and agrees to proceed as outlined above. All of his questions were answered to his satisfaction.  He has been asked to call with any questions or concerns or if his condition acutely worsens.      This document was created with the assistance of voice-to-text technology. Effort has been made to minimize transcription errors. Please allow for homonyms and other similar transcription errors.

## 2018-02-05 NOTE — Telephone Encounter (Signed)
This will be processed via the workqueue.

## 2018-02-05 NOTE — Telephone Encounter (Addendum)
Pt has kidney stones and needs some relief for the weekend prior to procedure on Monday the 11th.    HYDROcodone-acetaminophen (NORCO) 5-325 mg per tablet.    Please write script and call Pt when ready.    Pt was notified that Rx's can take up to 3 days to fill.    Pt expressed frustration that Rx may not get filled for 3 days and Pt noted he had to have Rx before weekend.    Pt was notified that pain med script can only be hardcopy and has to be picked up at our office.    Pt was instructed that he would receive a call when script was ready.    [place] [phone]

## 2018-02-05 NOTE — Telephone Encounter (Signed)
Please review and advise.

## 2018-02-05 NOTE — Telephone Encounter (Signed)
DATE SCHEDULED: 02/08/18  Provider Isaac Bliss  Location [facility]  outpatient  CPT CODES:  9182423777  DX CODES:  N20.1  If applicable: left  Quantity: 1  Medical Insurance:  [employer]  Worker's Comp (Yes or No): No  ?  ADDITIONAL COMMENTS?

## 2018-02-05 NOTE — Telephone Encounter (Signed)
Patient came to the door around 4:55pm and the door was locked.  He appeared to look upset so I got the office manager who let him in and the patient had requested more pain medications.  Dr was asked if there was a prescription for him and he advised that he was not able to fill this prescription again.  Patient was very upset and used aggressive behavior and was cursing at staff.  He was escorted out of the building and requested we cancel his surgery.

## 2018-02-05 NOTE — Telephone Encounter (Signed)
Hello:    The insurance verifiers need to know the following information:    Could you tell me if this patient is STAT procedure? The case was scheduled as Elective and we'd normally ask if rescheduling was possible.. but your notes seem to say it's urgent?  I'm having to leave for the day , but if you could update the other ladies?

## 2018-02-09 NOTE — Telephone Encounter (Signed)
For your review

## 2018-02-09 NOTE — Telephone Encounter (Signed)
Received verbal authorization from Abby @ [employer]. Authorizaiton # [id number] and is valid for date of service.

## 2018-02-09 NOTE — Telephone Encounter (Signed)
Surgery canceled per patients request-  See note in epic regarding this incident

## 2018-02-17 ENCOUNTER — Encounter: Payer: PRIVATE HEALTH INSURANCE | Attending: MD

## 2018-02-23 ENCOUNTER — Encounter: Payer: PRIVATE HEALTH INSURANCE | Attending: MD

## 2018-03-22 NOTE — Telephone Encounter (Signed)
Certified mail was sent out and delivery was attempted 02/17/18, 02/25/18 and returned 03/07/18 and we got the letter back in the office on 03/22/18

## 2019-09-06 ENCOUNTER — Other Ambulatory Visit: Admit: 2019-09-06 | Discharge: 2019-09-06 | Payer: PRIVATE HEALTH INSURANCE

## 2019-09-06 DIAGNOSIS — Z20828 Contact with and (suspected) exposure to other viral communicable diseases: Secondary | ICD-10-CM

## 2019-09-06 LAB — SARS-COV-2 (COVID-19), SCREENING/ASYMPTOMATIC UNEXPOSED: SARS-CoV-2 (COVID-19) by RT-PCR: NOT DETECTED

## 2021-04-04 IMAGING — MR MRI SHOULDER RT WO CONTRAST
4 series · 40 of 40 positions shown · non-contrast
Comparison: None.

INDICATION: Right shoulder pain. Evaluate for labral pathology.
TECHNIQUE: Multiplanar, multiecho imaging of the right shoulder was performed, including T1-weighted and fluid sensitive sequences without intravenous contrast.

[Series 5: t2_axial_fs · axial · right · 3.0mm · 0.50mm/px · z∈[-20,+70]mm · 10 of 24 slices shown]
[im 1/24]
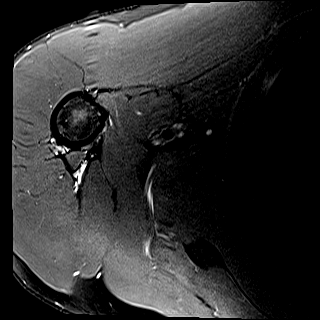
[im 3/24]
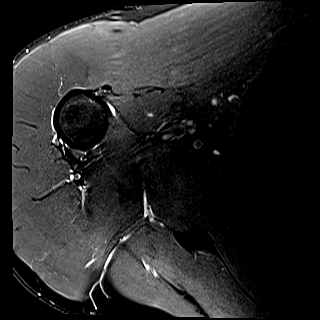
[im 6/24]
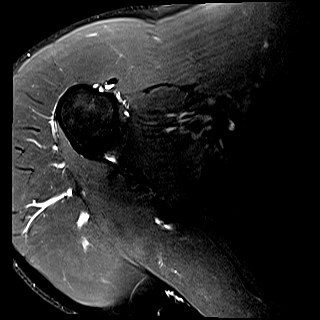
[im 8/24]
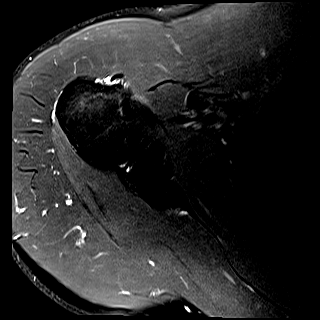
[im 11/24]
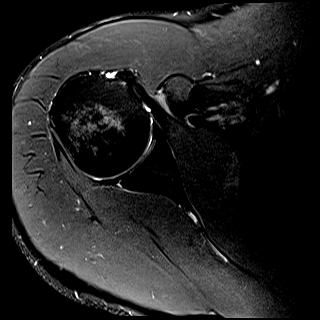
[im 13/24]
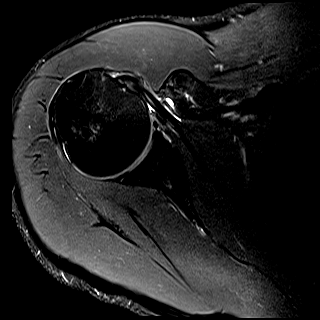
[im 16/24]
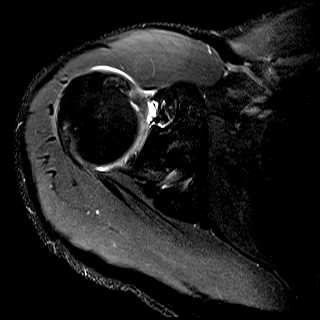
[im 18/24]
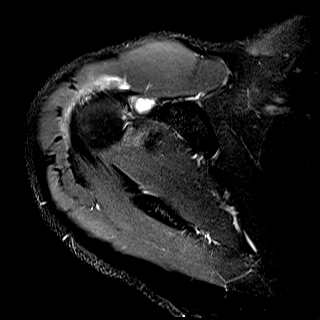
[im 21/24]
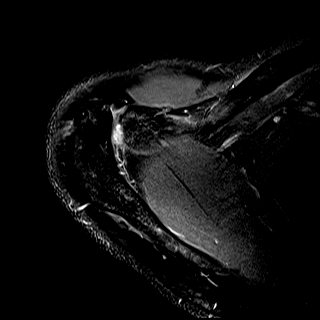
[im 24/24]
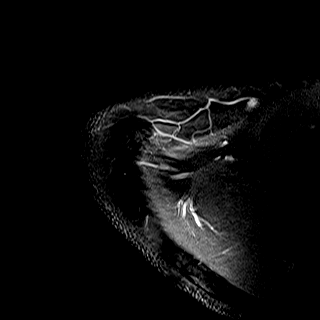

[Series 6: t2_cor_obl_fs · oblique · right · 3.0mm · 0.44mm/px · 8 of 19 slices shown]
[im 1/19]
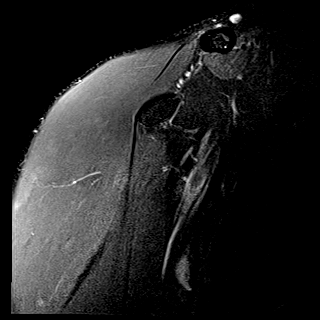
[im 3/19]
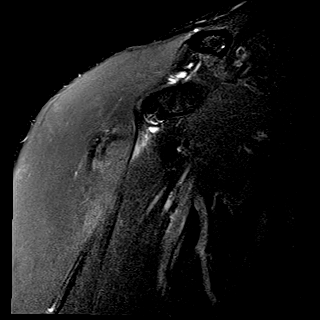
[im 6/19]
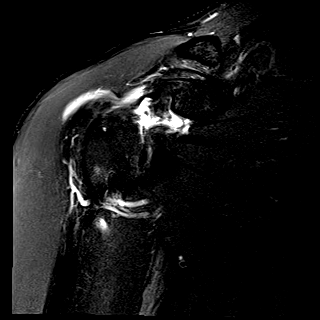
[im 8/19]
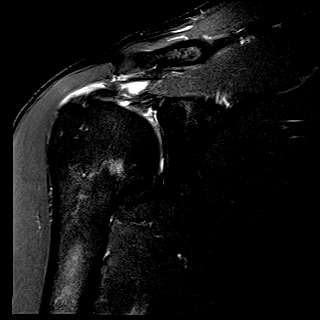
[im 11/19]
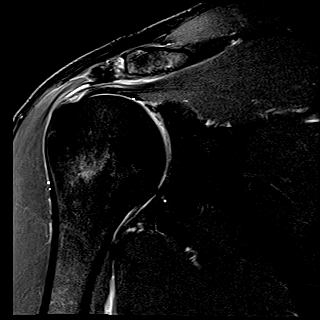
[im 13/19]
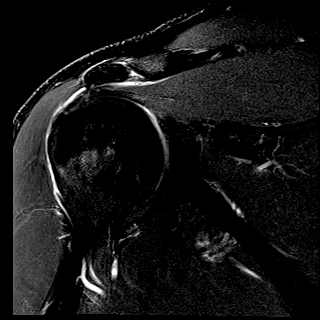
[im 16/19]
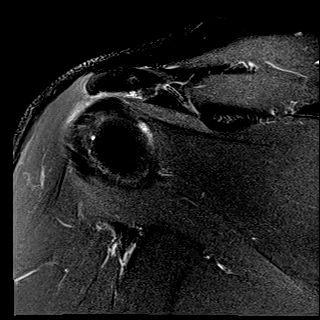
[im 19/19]
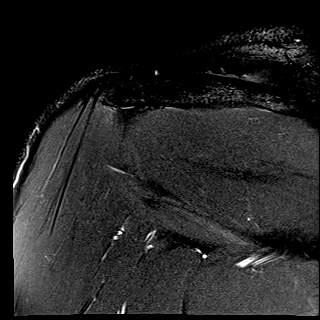

[Series 7: t2_sag_obl_fs · oblique · right · 3.0mm · 0.44mm/px · 11 of 28 slices shown]
[im 1/28]
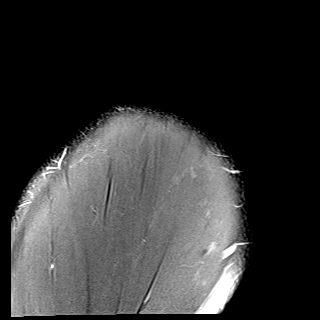
[im 3/28]
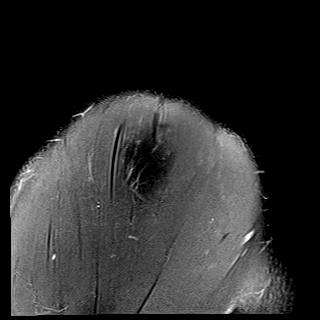
[im 6/28]
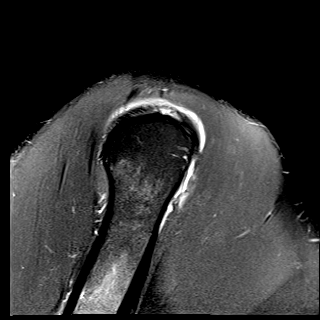
[im 9/28]
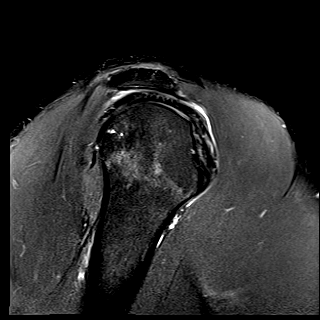
[im 11/28]
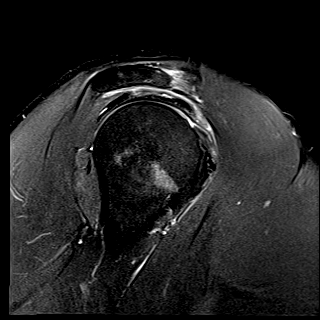
[im 14/28]
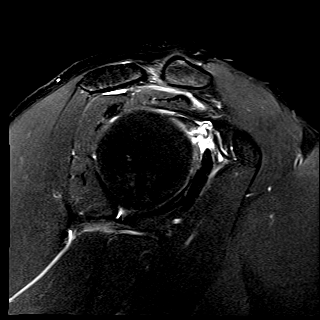
[im 17/28]
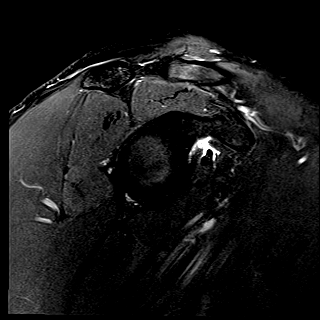
[im 19/28]
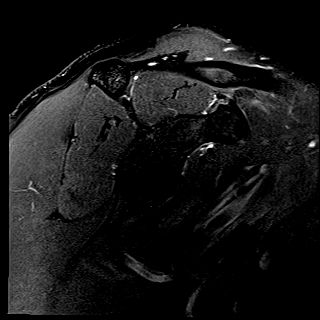
[im 22/28]
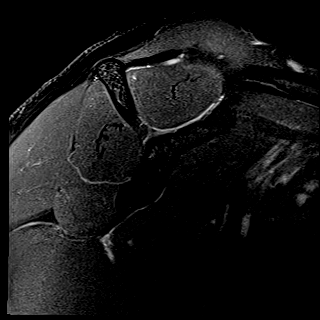
[im 25/28]
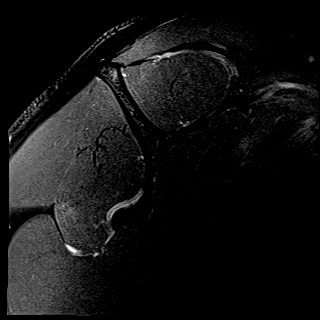
[im 28/28]
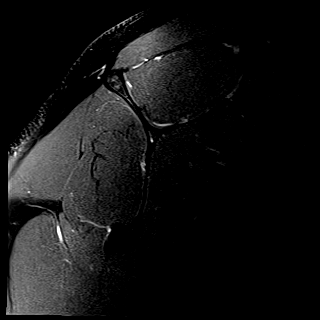

[Series 8: t1_sag_obl · oblique · right · 3.0mm · 0.36mm/px · 11 of 28 slices shown]
[im 1/28]
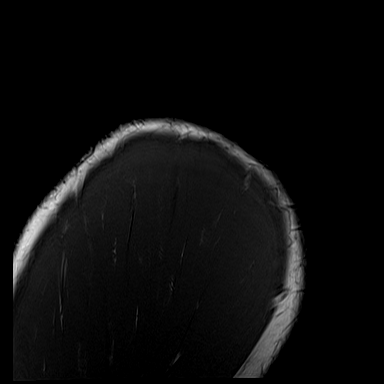
[im 3/28]
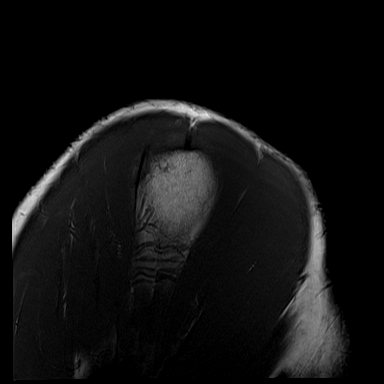
[im 6/28]
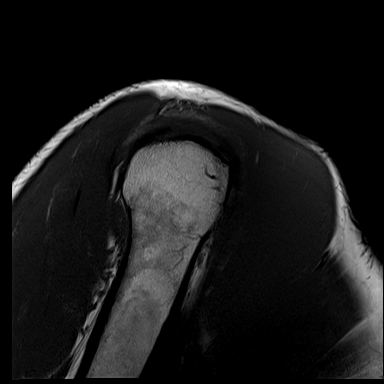
[im 9/28]
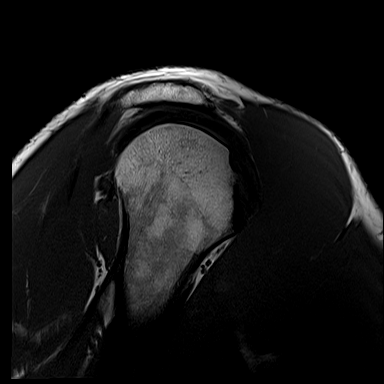
[im 11/28]
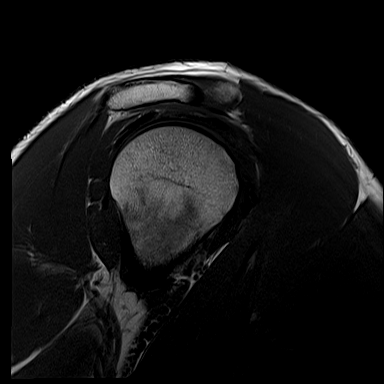
[im 14/28]
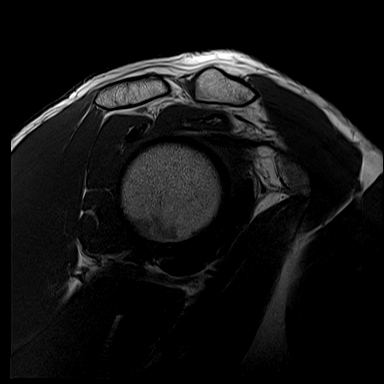
[im 17/28]
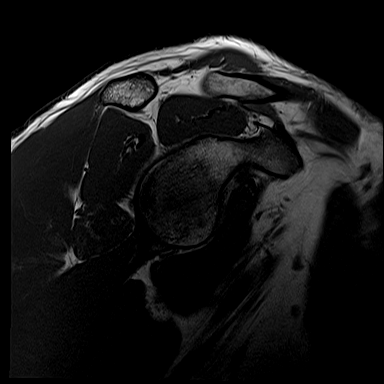
[im 19/28]
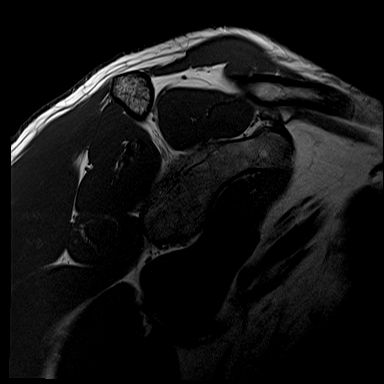
[im 22/28]
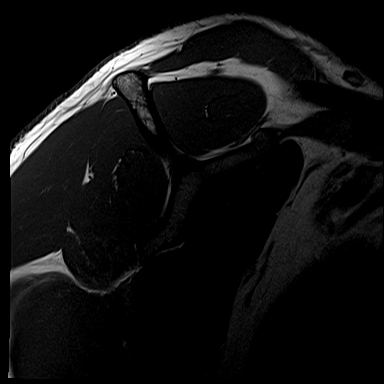
[im 25/28]
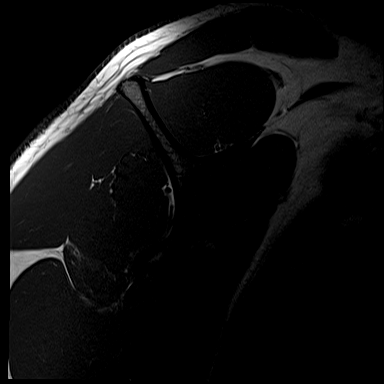
[im 28/28]
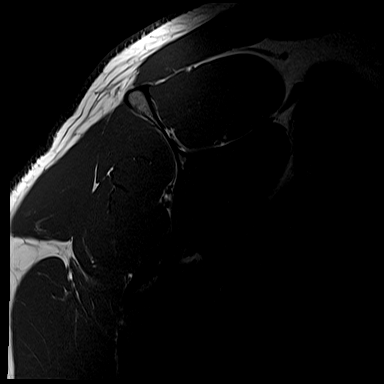

[40 of 40 positions shown; findings below may reference images not displayed]

FINDINGS: Rotator cuff:

Supraspinatus and infraspinatus: Moderate tendinosis of the supraspinatus. Infraspinatus tendon is intact. No tear.

Subscapularis: Small partial-thickness undersurface tear at the superior insertion.

Teres minor: Intact. 

Cuff muscles: Normal in size and signal intensity.  No fatty infiltration.

Acromioclavicular joint: Normal.

TICO bursa: No bursitis.

Long head biceps tendon: Mild tendinosis of the intra-articular portion.

Rotator Interval: No scar or obliteration of fat.

Labrum: Large tear of the posterior-superior labrum, extending into the posterior inferior labrum.

Cartilage: Minimal chondral surface irregularity.

Marrow: Within normal limits.

No joint effusion. No mass. No fluid collection.
IMPRESSION: 1. Moderate focal tendinosis of the right supraspinatus tendon, at the insertion. No tear.

2. Small partial-thickness undersurface tear of the subscapularis tendon, at the superior insertion.

3. Mild tendinosis of the intra-articular long head biceps.

4. Large tear of the posterior-superior labrum, extending into the posterior-inferior labrum.

## 2021-11-26 IMAGING — MR MRA HEAD WO CONTRAST
3 series · 29 of 48 positions shown · non-contrast
Comparison: None.

HISTORY: 40-year-old male. Cerebral infarction. History of brain aneurysm.
TECHNIQUE: MR angiography study of the head was performed using 2-D and 3-D time-of-flight sequences through the circle of Willis. Multiplanar, 2-D and 3-D reconstructed images were obtained. Post-processing software generated rotating 3-D and MIP images performed under concurrent physician supervision.

[Series 7: t1_sag · sagittal · 5.0mm · 0.72mm/px · 6 of 23 slices shown]
[im 1/23]
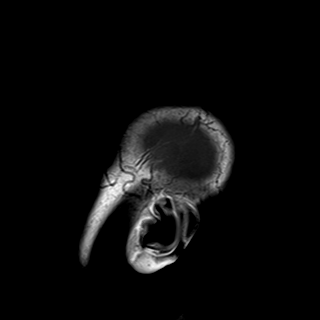
[im 5/23]
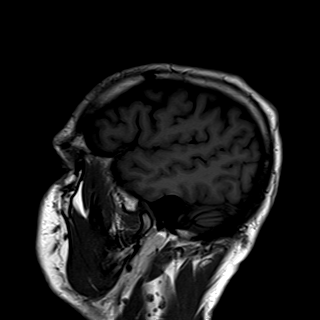
[im 9/23]
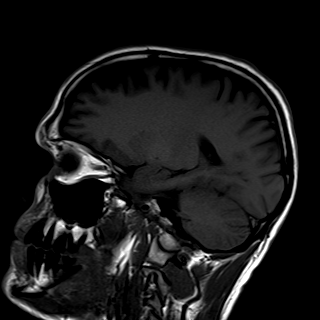
[im 14/23]
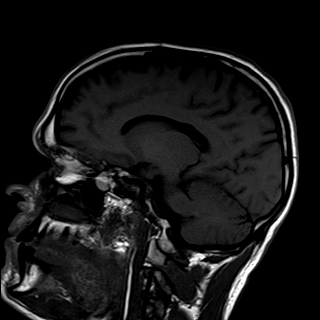
[im 18/23]
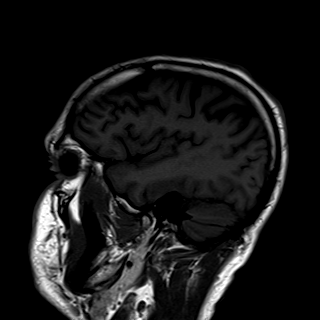
[im 23/23]
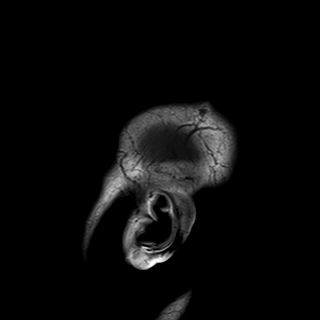

[Series 8: tof_fl3d_cow · axial · 0.5mm · 0.41mm/px · z∈[-43,+12]mm · 16 of 134 slices shown]
[im 1/134]
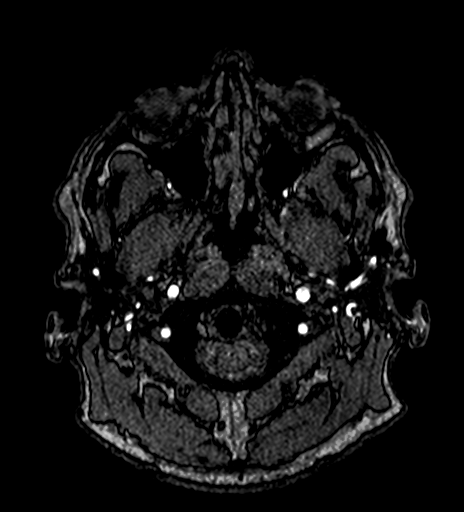
[im 4/134]
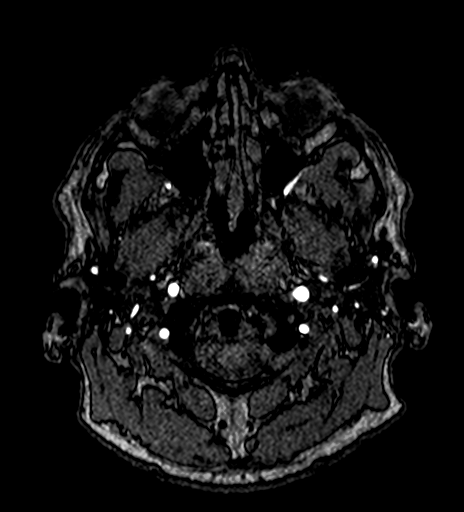
[im 8/134]
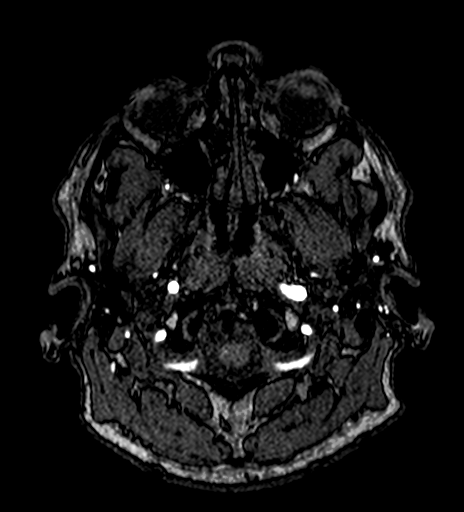
[im 12/134]
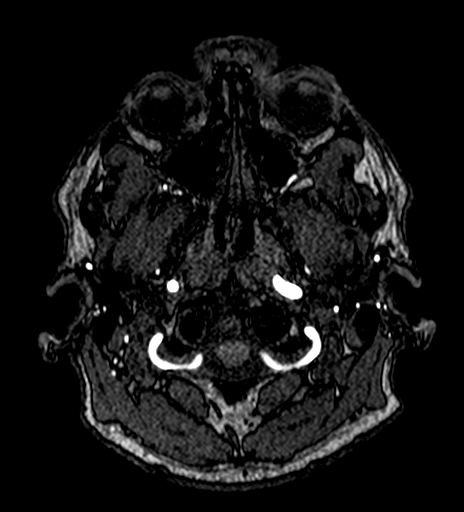
[im 16/134]
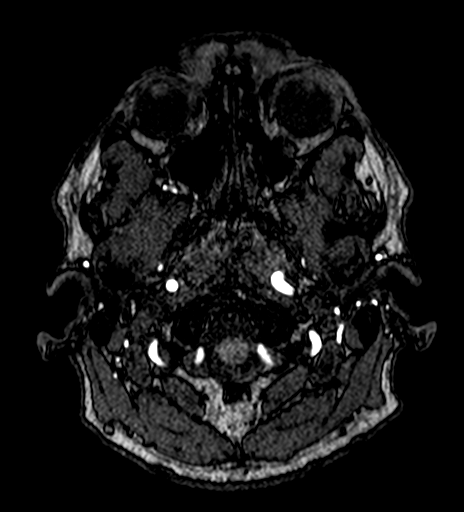
[im 20/134]
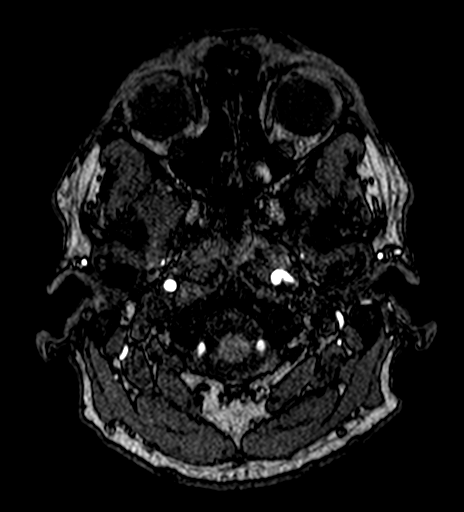
[im 24/134]
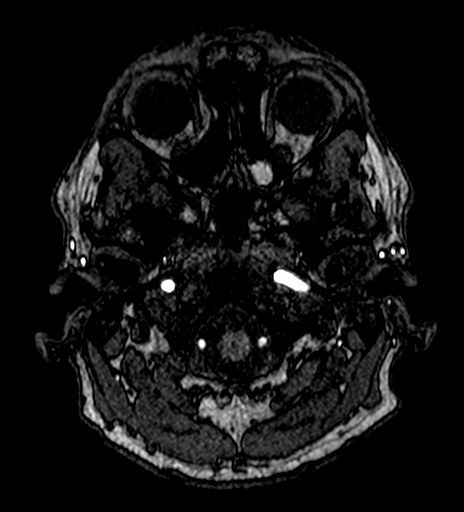
[im 28/134]
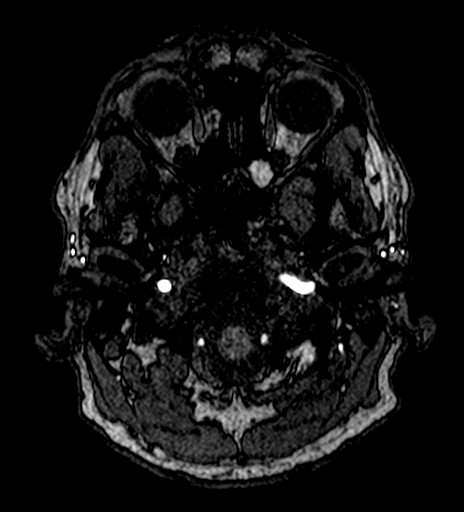
[im 40/134]
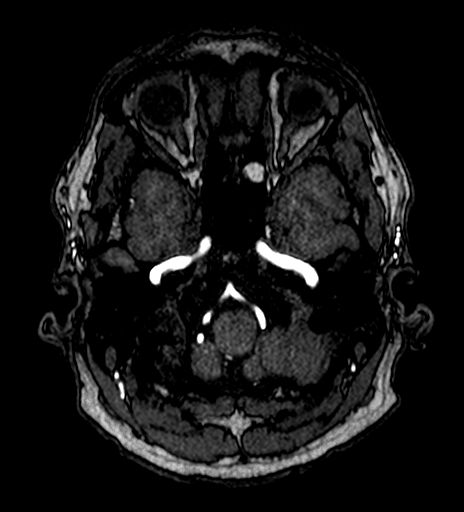
[im 59/134]
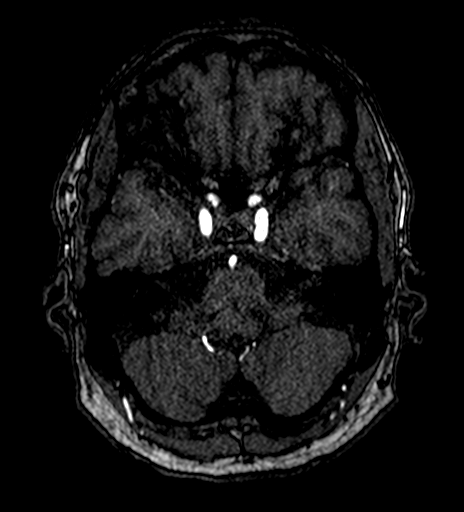
[im 67/134]
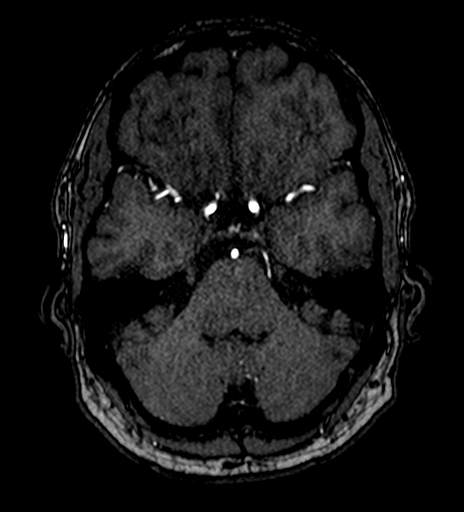
[im 75/134]
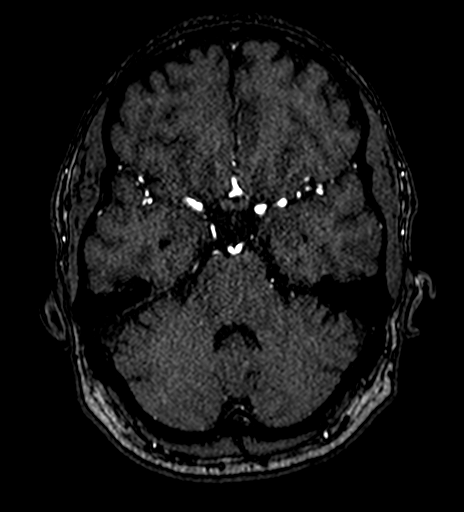
[im 94/134]
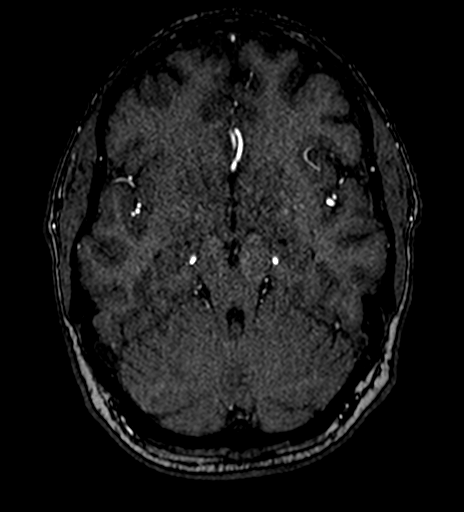
[im 110/134]
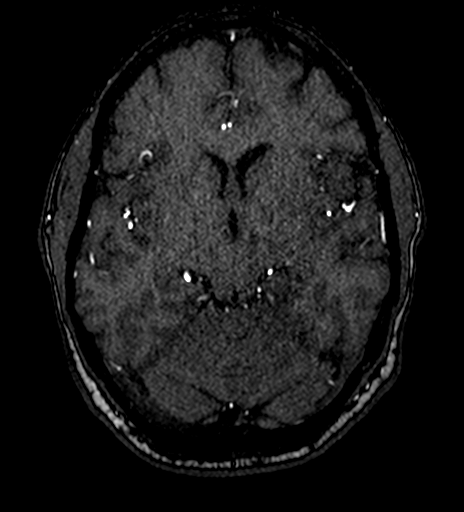
[im 114/134]
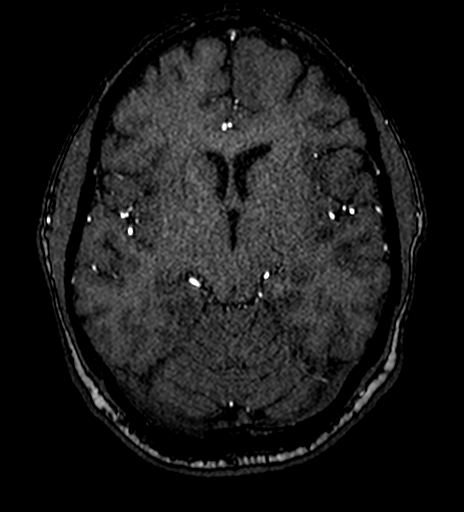
[im 126/134]
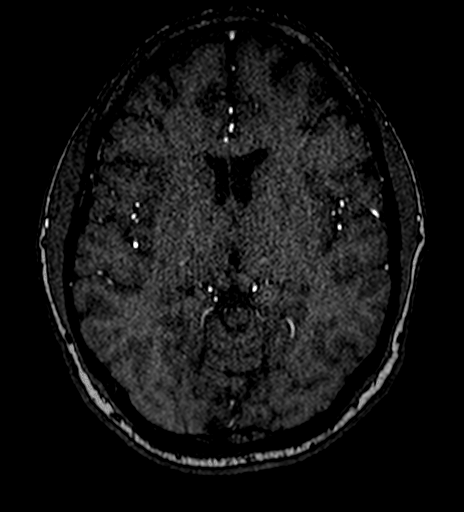

[Series 13: t1_axial fs · axial · 4.0mm · 0.72mm/px · z∈[-102,+43]mm · 7 of 29 slices shown]
[im 1/29]
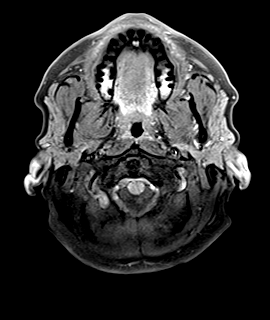
[im 5/29]
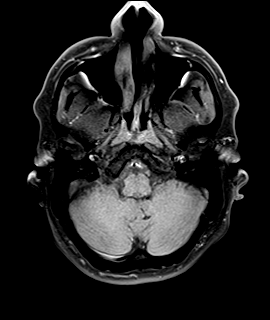
[im 10/29]
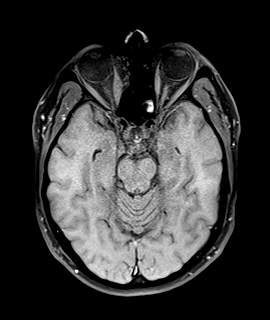
[im 15/29]
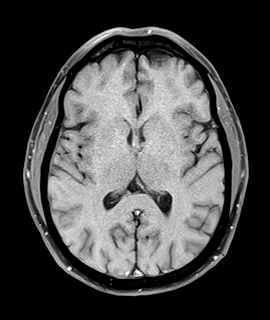
[im 19/29]
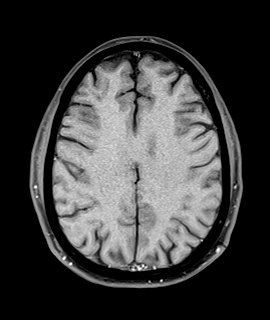
[im 24/29]
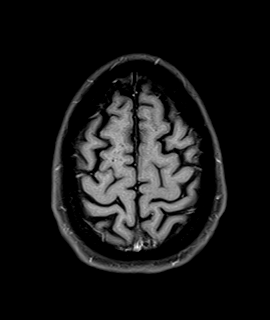
[im 29/29]
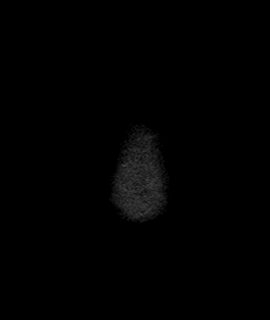

[29 of 48 positions shown; findings below may reference images not displayed]

FINDINGS: Petrous and cavernous portion of both internal carotid arteries: The petrous and cavernous portions of both internal carotid arteries are patent.

Right anterior and middle cerebral arteries: Patent.

Left anterior and middle cerebral arteries: Patent.

Distal vertebral arteries: Patent.

Basilar artery and both posterior cerebral arteries: Patent.

Retention cyst/mucosal thickening is identified in the left posterior ethmoid air cells.

2-D and 3-D reconstructed images confirm the above findings.
IMPRESSION: 1.
Patent major intracranial vessels.

2.
No MRA evidence of aneurysm. Please note that the aneurysms 3 mm or smaller may not be detected by this technique secondary to the limits of resolution.

3.
Retention cyst/mucosal thickening identified in the left posterior ethmoid air cells.

## 2021-11-27 IMAGING — MR MRI BRAIN WO CONTRAST
8 series · 32 of 48 positions shown · non-contrast
Comparison: MRA head 11/26/21

INDICATION: 40-year-old male with cerebral infarction, history of brain aneurysm.
TECHNIQUE: Multiplanar multisequence MRI of the brain was performed without intravenous contrast.

[Series 5: flair_axial fs · axial · 4.0mm · 0.47mm/px · z∈[-67,+81]mm · 3 of 30 slices shown]
[im 1/30]
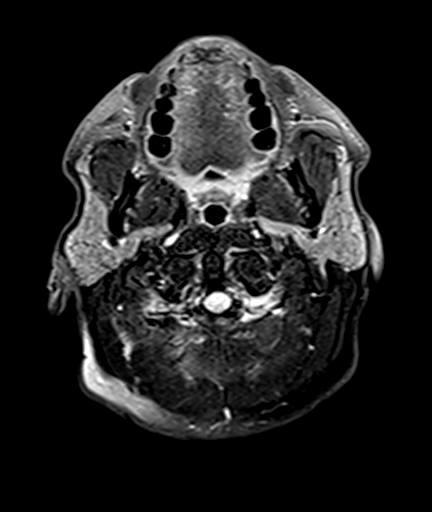
[im 15/30]
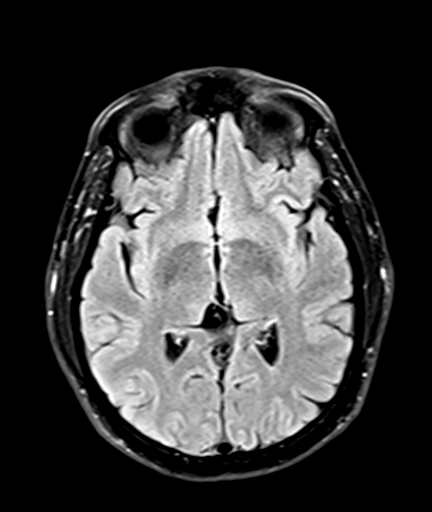
[im 30/30]
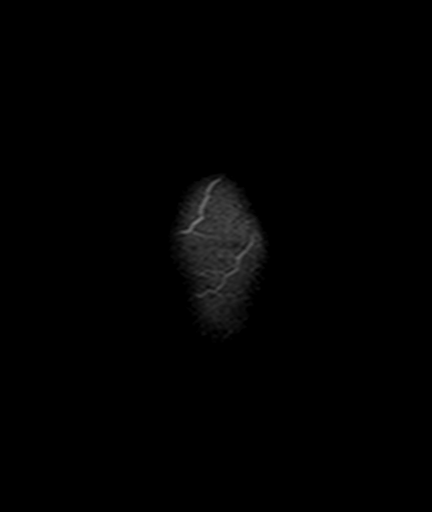

[Series 6: t2_axial · axial · 4.0mm · 0.75mm/px · z∈[-67,+81]mm · 2 of 30 slices shown]
[im 1/30]
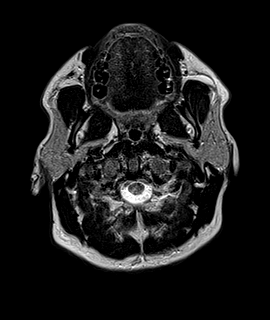
[im 30/30]
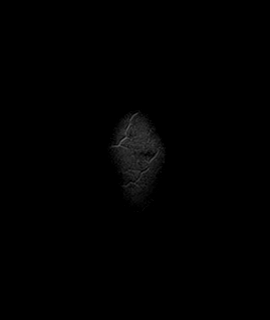

[Series 7: DWI · axial · 4.0mm · 1.36mm/px · z∈[-64,+79]mm · 2 of 29 slices shown (1 of 2)]
[im 1/29]
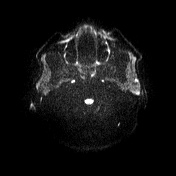
[im 29/29]
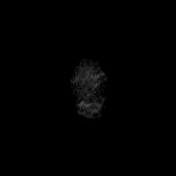

[Series 8: DWI · axial · 4.0mm · 1.36mm/px · z∈[-69,+79]mm · 2 of 30 slices shown (2 of 2)]
[im 1/30]
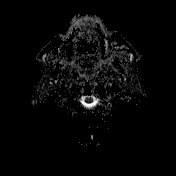
[im 30/30]
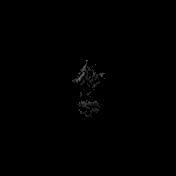

[Series 9: flash_axial · axial · 4.0mm · 0.94mm/px · z∈[-67,+81]mm · 2 of 30 slices shown]
[im 1/30]
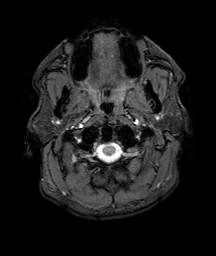
[im 30/30]
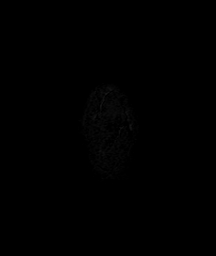

[Series 10: t1_mprage axial · axial · 1.0mm · 0.94mm/px · z∈[-71,+84]mm · 8 of 159 slices shown]
[im 1/159]
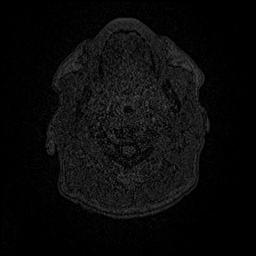
[im 29/159]
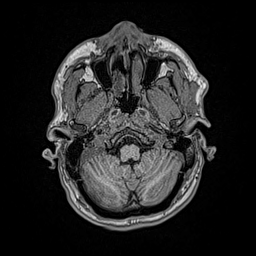
[im 44/159]
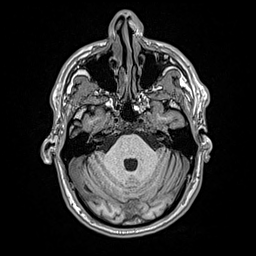
[im 72/159]
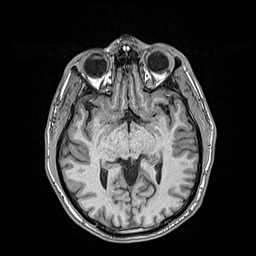
[im 87/159]
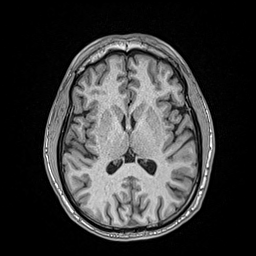
[im 115/159]
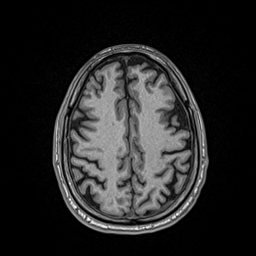
[im 130/159]
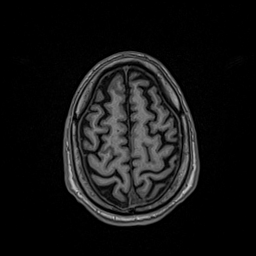
[im 159/159]
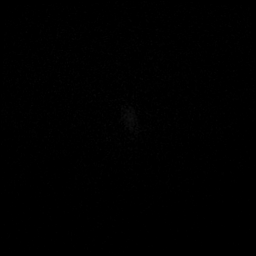

[Series 11: t1_mprage axial_mpr_mprage cor · coronal · 1.0mm · 0.47mm/px · 8 of 190 slices shown]
[im 1/190]
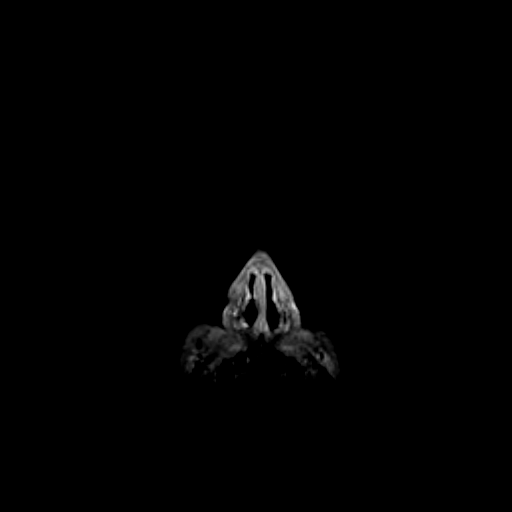
[im 30/190]
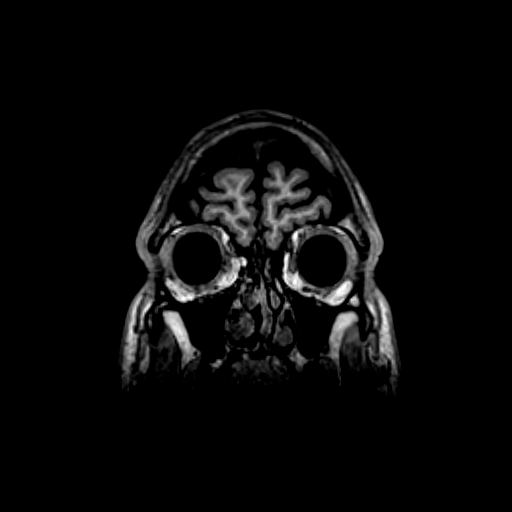
[im 59/190]
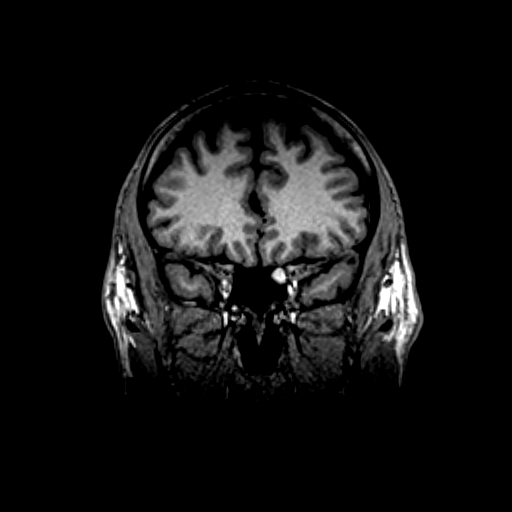
[im 88/190]
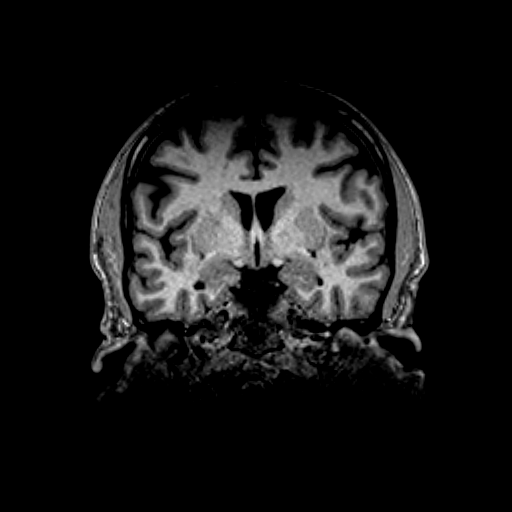
[im 102/190]
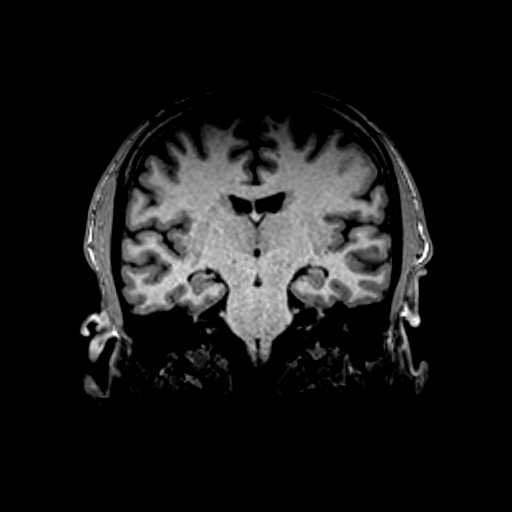
[im 131/190]
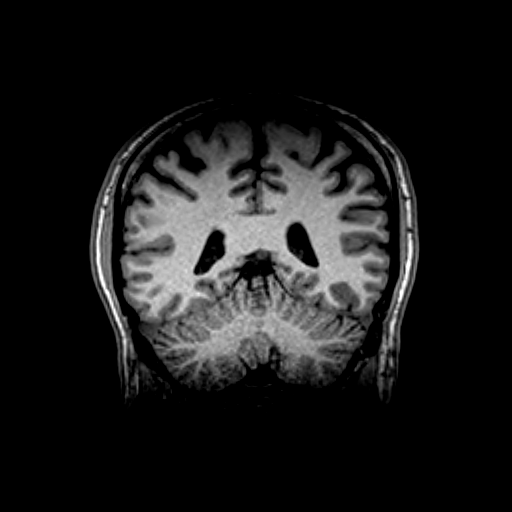
[im 160/190]
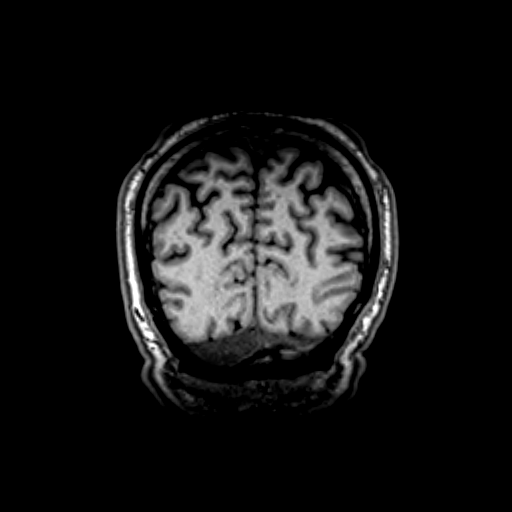
[im 190/190]
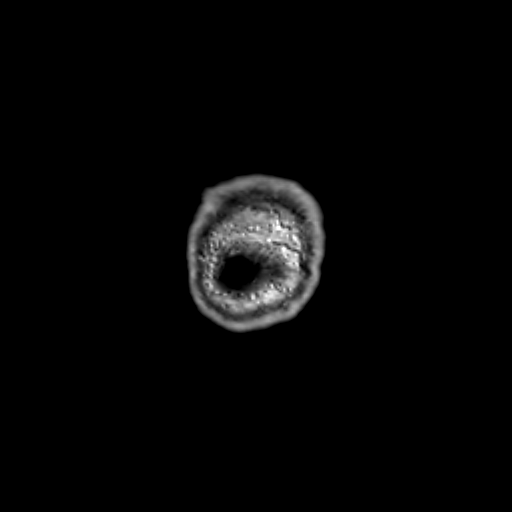

[Series 12: t1_mprage axial_mpr_mprage sag · sagittal · 1.0mm · 0.47mm/px · 5 of 150 slices shown]
[im 1/150]
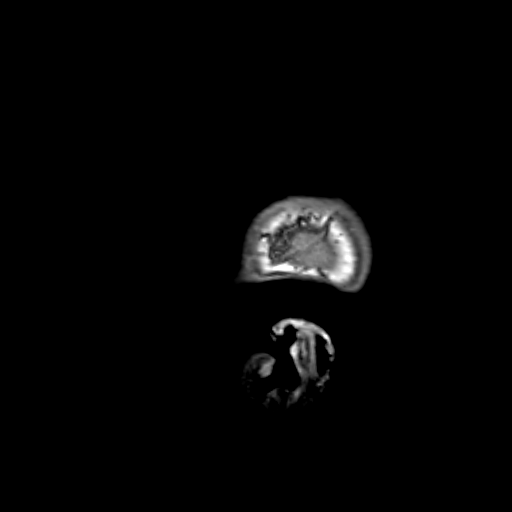
[im 30/150]
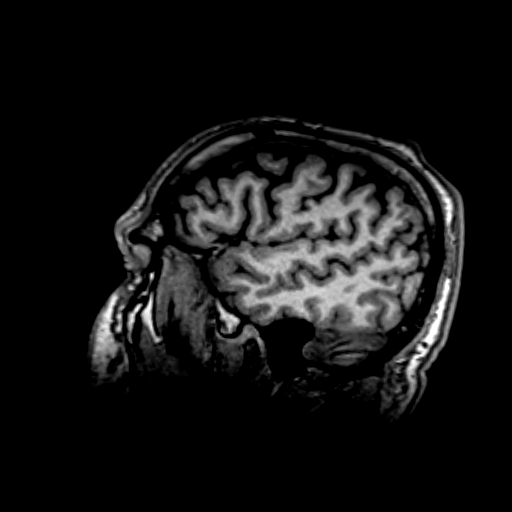
[im 45/150]
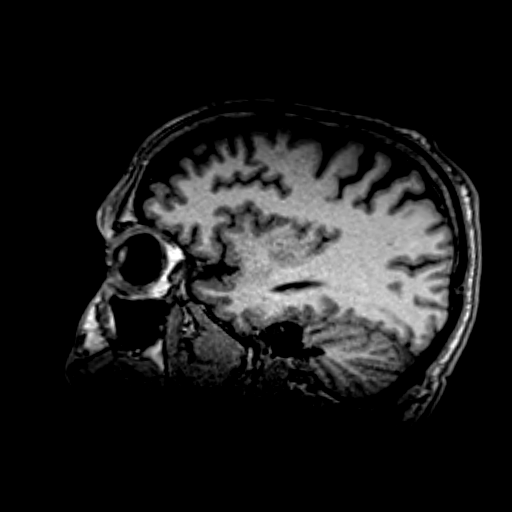
[im 60/150]
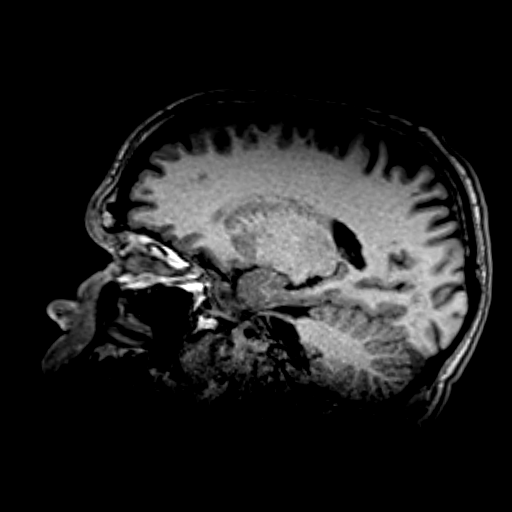
[im 90/150]
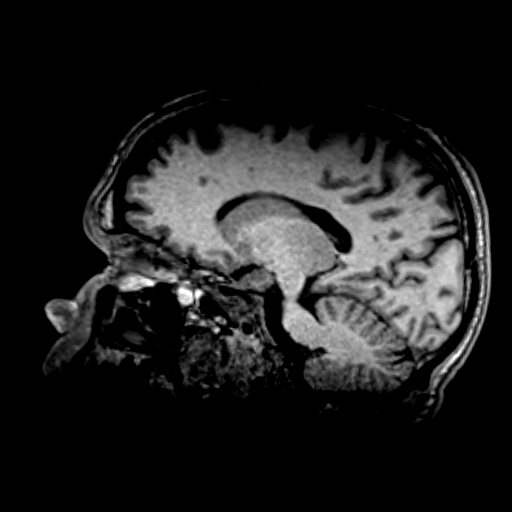

[32 of 48 positions shown; findings below may reference images not displayed]

FINDINGS: Brain parenchyma: No acute infarct or hemorrhage.    

Ventricles: No midline shift, herniation or hydrocephalus.

Extra-axial spaces: No extra-axial fluid collection.

Extracranial structures: Mucous retention cyst in the left posterior ethmoid air cells. Minimal mucosal thickening in the bilateral ethmoid air cells. Marrow signal normal. Orbits unremarkable. Soft tissues normal.
IMPRESSION: No significant intracranial abnormality.

## 2022-01-22 NOTE — Telephone Encounter (Signed)
 Formatting of this note might be different from the original.  01/25 Left a detailed messaged about having to select a new provider.   Electronically signed by [facility] at 01/22/2022  3:54 PM CST

## 2023-06-09 IMAGING — US US LIVER
1 series · 14 of 25 positions shown · non-contrast
Comparison: Liver elastography June 08, 2023

Images Obtained from [facility]
INDICATION: 41 years-old Male with Abnormal levels of other serum enzymes.
TECHNIQUE: Using real-time and Doppler ultrasound, the liver and gallbladder were evaluated.

[Series 3: us liver · 14 of 44 slices shown]
[im 1/44]
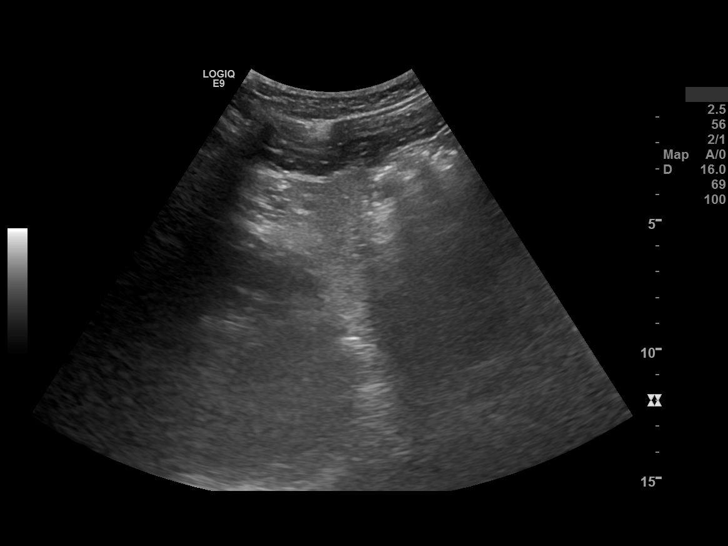
[im 4/44]
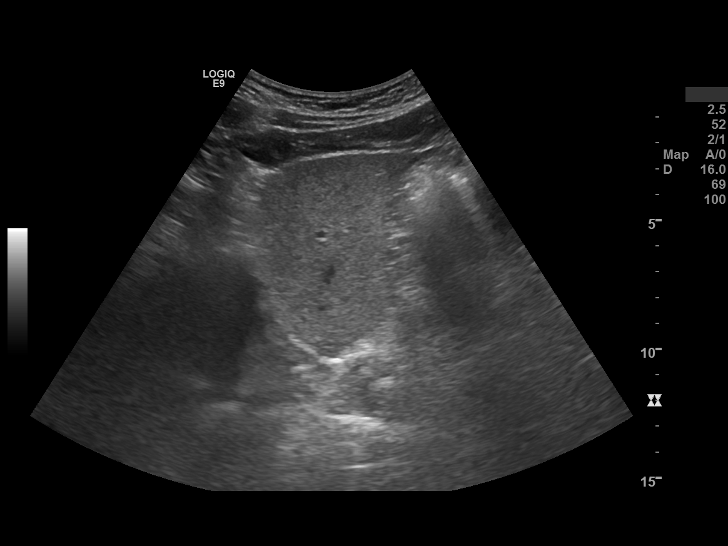
[im 8/44]
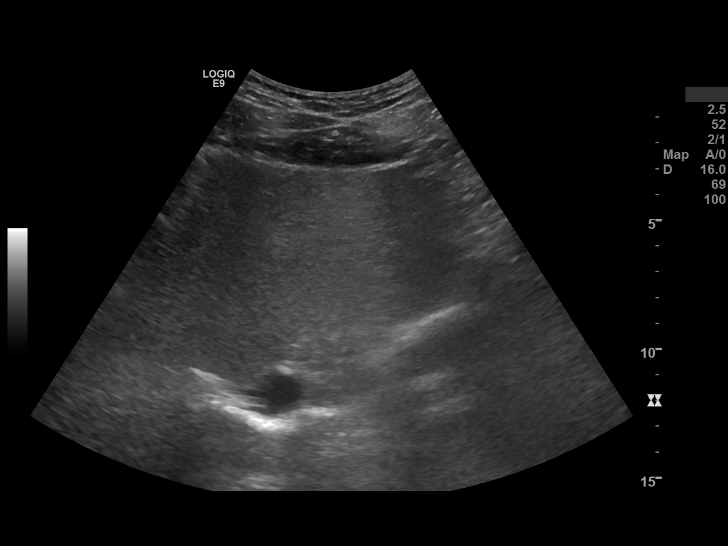
[im 11/44]
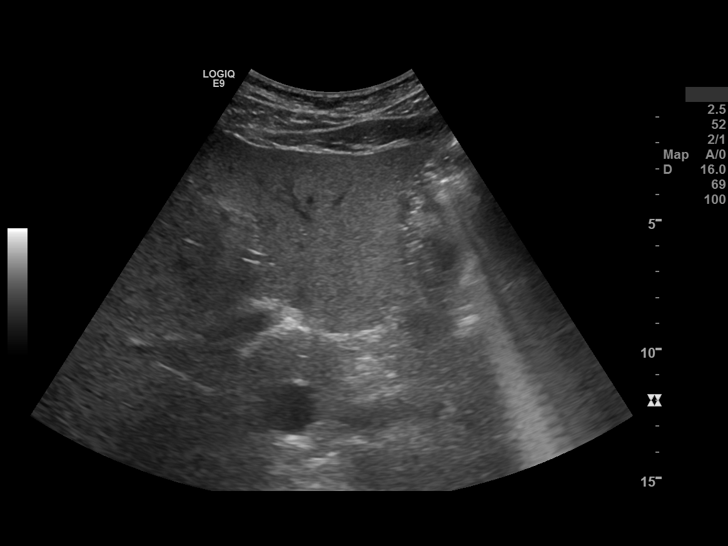
[im 15/44]
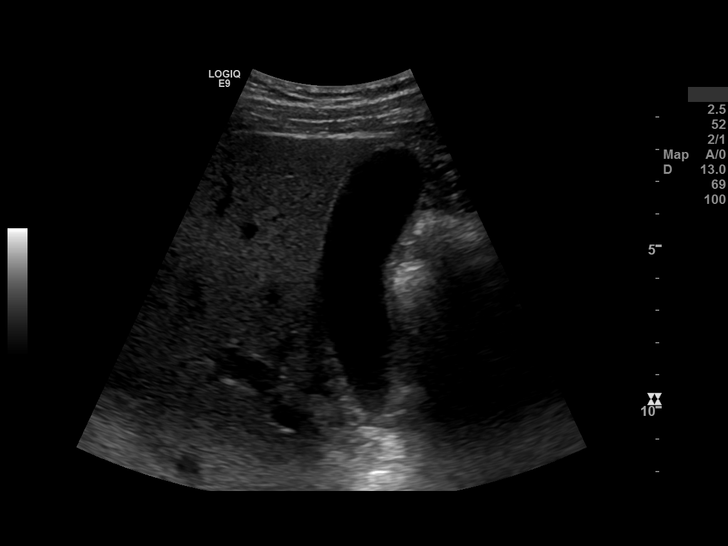
[im 17/44]
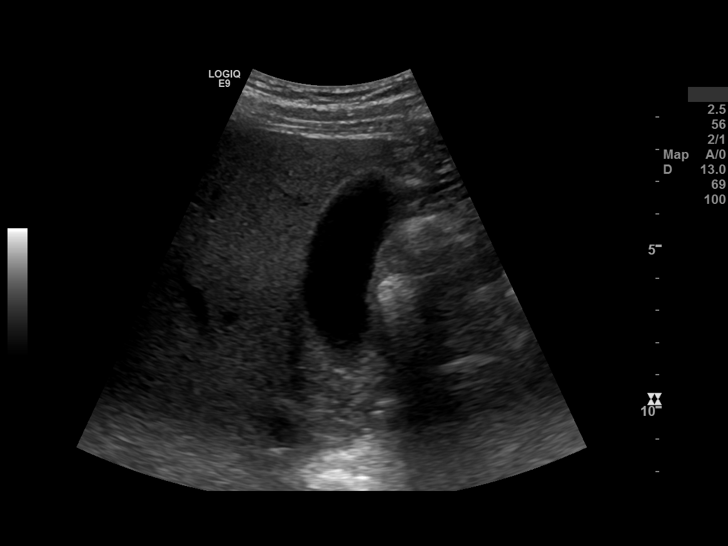
[im 20/44]
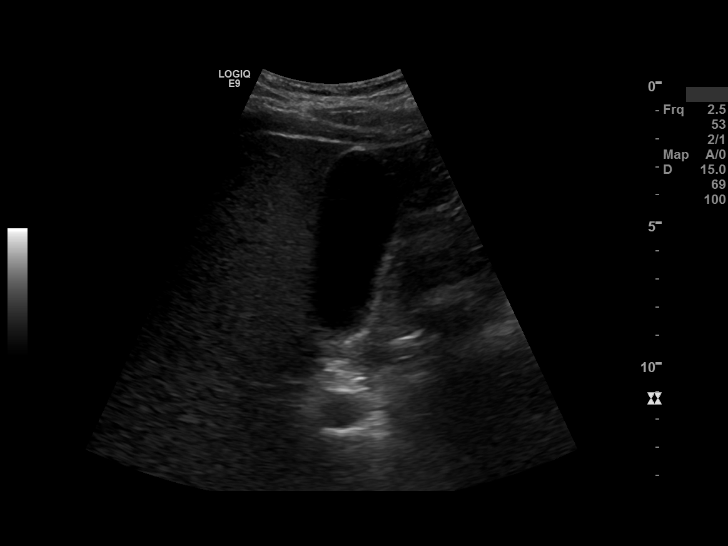
[im 24/44]
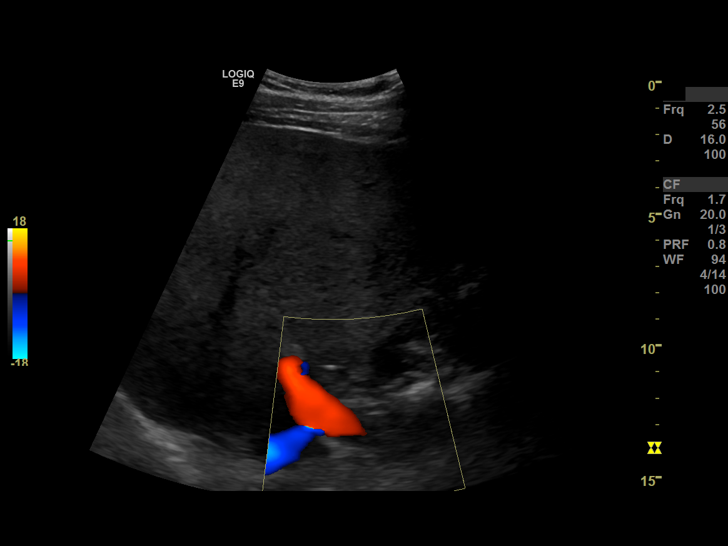
[im 27/44]
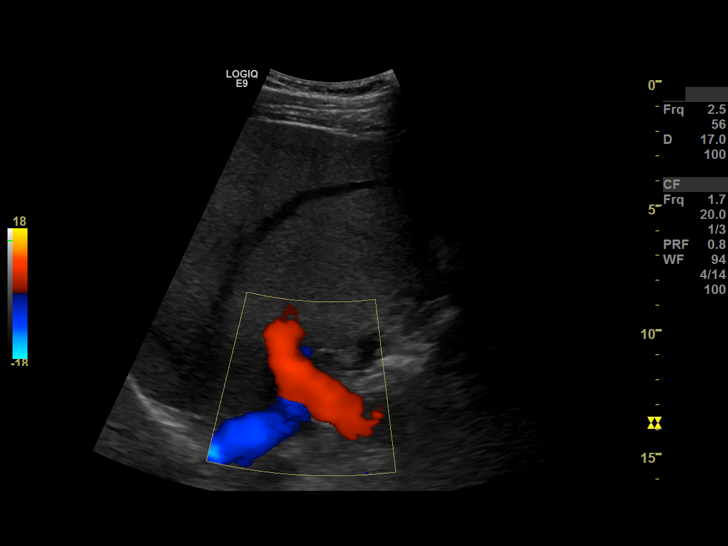
[im 29/44]
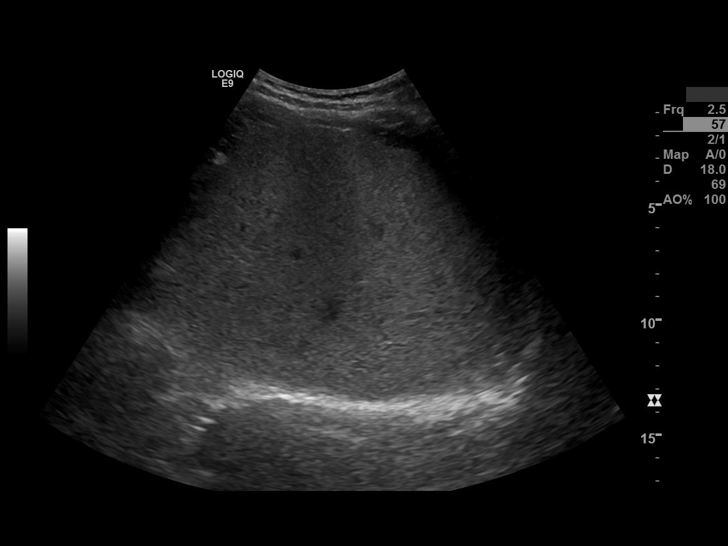
[im 33/44]
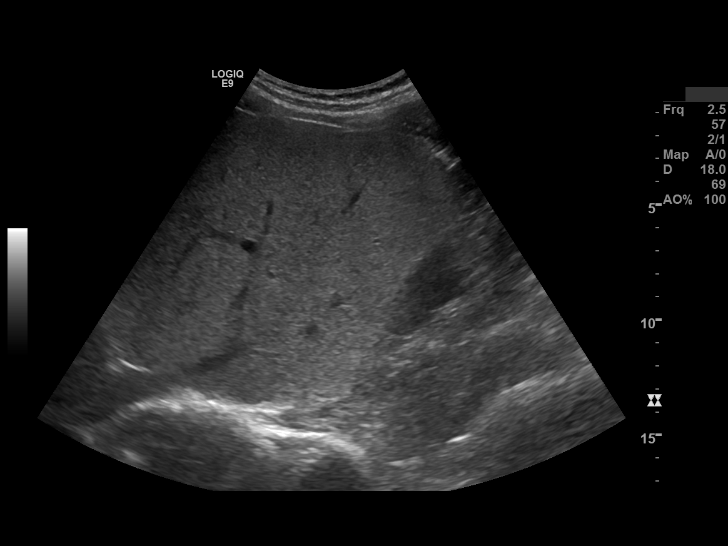
[im 36/44]
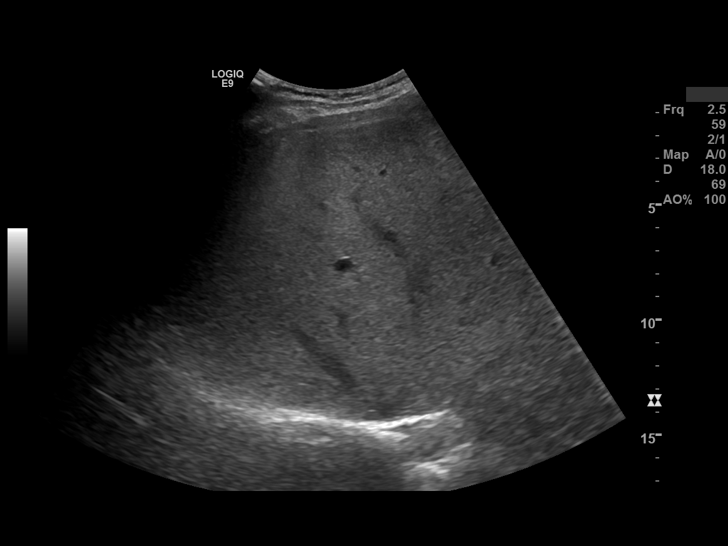
[im 40/44]
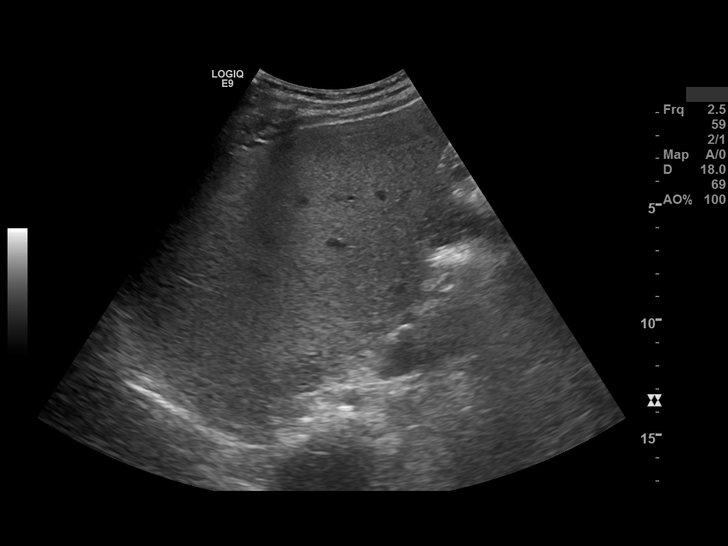
[im 44/44]
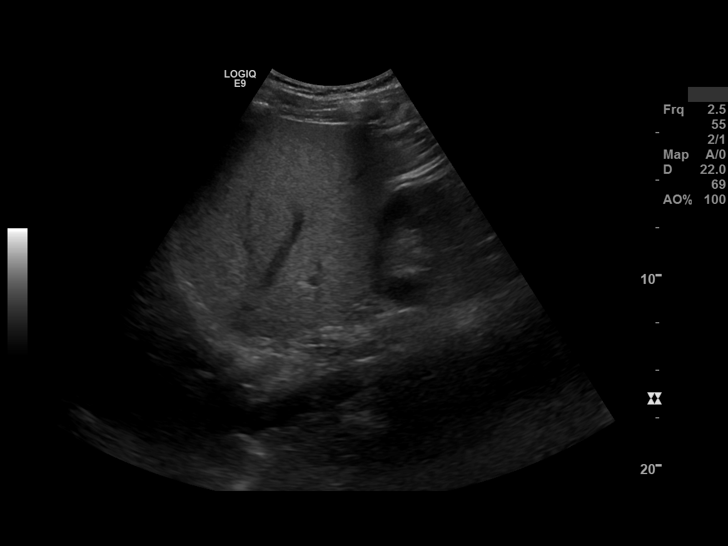

[14 of 25 positions shown; findings below may reference images not displayed]

FINDINGS: Liver: Length of  158 mm in the right midclavicular line. The liver is of normal size with diffuse increased echotexture which may be due to fatty liver infiltration versus hepatocellular disease.
Hepatopetal flow is identified in the portal vein. No focal masses identified. No intrahepatic biliary ductal dilatation is seen.
Gallbladder: The gallbladder is normally distended with no sludge or gallstone. The gallbladder wall thickness measures 2.6 mm. There is a  negative sonographic Murphy's sign. The common bile duct
measures 3.63  mm.
No evidence of ascites is seen.
IMPRESSION: 1.  Normal liver in size with fatty liver infiltration versus hepatocellular disease.
2.  Normal gallbladder.
3.  No evidence of ascites.

## 2024-11-15 ENCOUNTER — Inpatient Hospital Stay
Admit: 2024-11-15 | Discharge: 2024-11-15 | Disposition: A | Discharge status: Home / Self Care | Payer: BC Managed Care – PPO | Attending: PA-C

## 2024-11-15 DIAGNOSIS — J069 Acute upper respiratory infection, unspecified: Secondary | ICD-10-CM

## 2024-11-15 LAB — POC IC RAPID STREP: Strep A Antigen: NEGATIVE

## 2024-11-15 LAB — THROAT CULTURE FOR BETA STREP: Culture Result: 2 — AB

## 2024-11-15 NOTE — ED Provider Notes (Signed)
 [facility]    History   Manuel Perkins is a 43 y.o. year old male presenting with Sore Throat      HPI  Patient is a 43 year old male with no relevant past medical history on file who presents to the immediate care facility for sore throat, cough times about 5-7 days.  Reports subjective fever few days ago.  Some sinus congestion.  Notes that his children have strep throat and he would like to be tested for strep throat.  Cough is better with Benadryl or NyQuil  Past Medical History:   Diagnosis Date    Kidney stone        Past Surgical History:   Procedure Laterality Date    MIDDLE EAR SURGERY Left     SHOULDER SURGERY Left 2008       Family History   Problem Relation Age of Onset    Aneurysm Father         brain    Alzheimer's disease Paternal Grandfather        Social History[1]  Other Social History Comments:       Home Medications       Med List Status: Initial Review Done Set By: Dawna Pu, CMA at 11/15/2024 10:19 AM          Last Dose     atorvastatin (LIPITOR) 40 mg tablet Past Month     Take 40 mg by mouth once daily.     ibuprofen (ADVIL,MOTRIN) 200 MG tablet Past Month     Take 200 mg by mouth every 6 hours as needed for Pain.     tamsulosin  (FLOMAX ) 0.4 mg Cap (Expired)  --     Take 1 capsule by mouth daily for 7 days. Take 30 minutes after the same meal each day.          Flagged for Removal           Last Dose     HYDROcodone -acetaminophen  (NORCO) 5-325 mg per tablet  --     Take 1 tablet by mouth every 6 hours as needed for Pain (severe pain).            Review of Systems   All other systems reviewed and are negative.      Physical Exam   BP 136/86   Pulse 80   Temp 36.9 ?C (98.4 ?F)   Resp 16   SpO2 98%     Physical Exam  Vitals and nursing note reviewed.   Constitutional:       General: He is not in acute distress.     Appearance: He is not diaphoretic.   HENT:      Head: Normocephalic and atraumatic.      Mouth/Throat:      Mouth: Mucous  membranes are moist. No oral lesions.      Pharynx: No posterior oropharyngeal erythema.      Tonsils: No tonsillar exudate or tonsillar abscesses.   Eyes:      Conjunctiva/sclera: Conjunctivae normal.   Cardiovascular:      Rate and Rhythm: Normal rate.   Pulmonary:      Effort: Pulmonary effort is normal. No respiratory distress.      Breath sounds: Normal breath sounds. No wheezing or rales.   Chest:      Chest wall: No tenderness.   Musculoskeletal:      Cervical back: Normal range of motion.   Skin:  General: Skin is warm and dry.   Neurological:      General: No focal deficit present.      Mental Status: He is alert.   Psychiatric:         Mood and Affect: Mood normal.         Behavior: Behavior normal.         ED Course     Results for orders placed or performed during the hospital encounter of 11/15/24 (from the past 24 hours)   POCT IC Rapid Strep -Next Routine    Collection Time: 11/15/24 10:12 AM   Result Value Ref Range    Strep A Antigen Negative Negative       No orders to display       MEDICATIONS ADMINISTERED-LAST 24 Hours  (last 24 hrs)           ** No medications to display **            Procedures    Medical Decision Making  Immediate Care MDM  Triage summary, nursing notes, vital signs reviewed    Primary survey shows that the patient's airway, breathing and circulation are intact.     Vitals show he is afebrile.  No hypoxia or tachypnea    Initial plan is to review rapid strep test obtained from triage  RST is negative    Did discuss the possibility of COVID swab.  He politely declines this today.  Do feel as though this is reasonable as it would not change treatment at this point    There is no evidence for PTA or retropharyngeal abscess.  His rapid strep test is negative.  Believe he likely has a viral upper respiratory tract infection considering his very reassuring physical examination today.  Lungs are clear to auscultation and I do not concern for pneumonia currently.  Encouraged him to  drink plenty fluids get plenty of rest.  For new or worsening symptoms or return for reevaluation.  If his strep culture returns positive then we will call him and start him on antibiotics at that time.  Otherwise discussed treatment typically supportive.  Return precautions to the immediate care discussed.    I have discussed with patient results of workup. The patient understands and agrees with the plan.                      ED Disposition: Discharge      SNOMED CT(R)   1. Viral URI with cough  VIRAL UPPER RESPIRATORY TRACT INFECTION           *This dictation was produced using voice recognition software. Despite concurrent proofreading, please note that homonyms and other transcription errors may be present and that these errors may not truly reflect my intent.       [1]   Social History  Tobacco Use    Smoking status: Never    Smokeless tobacco: Former     Quit date: 02/05/2017   Substance Use Topics    Alcohol use: Yes     Comment: 1-3 wk    Drug use: No        Rosaline Degree, PA-C  11/15/24 1025

## 2024-11-15 NOTE — ED Triage Notes (Signed)
 Malaise for 1 week. Family has Strep.

## 2024-11-15 NOTE — Discharge Instructions (Signed)
 Drink plenty fluids and get plenty of rest    Recommend in general avoiding cough suppressants as if you have a productive cough if you want to expel any mucus that is in your lungs    Ibuprofen or acetaminophen  can be soothing for sore throat.  The liquid formation of this medication is often more soothing than the pill formation as the liquid can coat the throat and absorbed through the mucous membranes in the throat    For new or worsening symptoms such as worsening of your sore throat or difficulty breathing you should return for reevaluation    We will call you if we need to start you on antibiotics based off the results of your strep culture    Otherwise follow-up in the outpatient setting with your primary care physician

## 2024-11-17 MED ORDER — AMOXICILLIN 500 MG CAPSULE
500 | ORAL_CAPSULE | Freq: Two times a day (BID) | ORAL | 0 refills | 3.50000 days | Status: AC
Start: 2024-11-17 — End: 2024-11-27

## 2024-11-17 NOTE — Result Encounter Note (Signed)
 Not on abx. VM left. Attempt 1

## 2024-11-17 NOTE — Result Encounter Note (Signed)
 Spoke with the patient. Sent in amox
